# Patient Record
Sex: Male | Born: 1990 | Race: White | Hispanic: No | Marital: Single | Smoking: Current some day smoker
Health system: Southern US, Community
[De-identification: ages and names within clinical notes are randomized; demographics above are authoritative.]

---

## 2015-03-03 ENCOUNTER — Emergency Department (HOSPITAL_COMMUNITY): Payer: BLUE CROSS/BLUE SHIELD

## 2015-03-03 ENCOUNTER — Emergency Department (HOSPITAL_COMMUNITY)
Admission: EM | Admit: 2015-03-03 | Discharge: 2015-03-04 | Disposition: A | Discharge status: Home / Self Care | Payer: BLUE CROSS/BLUE SHIELD | Attending: Emergency Medicine | Admitting: Emergency Medicine

## 2015-03-03 ENCOUNTER — Encounter (HOSPITAL_COMMUNITY): Payer: Self-pay | Admitting: Emergency Medicine

## 2015-03-03 DIAGNOSIS — R112 Nausea with vomiting, unspecified: Secondary | ICD-10-CM | POA: Diagnosis not present

## 2015-03-03 DIAGNOSIS — R319 Hematuria, unspecified: Secondary | ICD-10-CM | POA: Diagnosis present

## 2015-03-03 DIAGNOSIS — R109 Unspecified abdominal pain: Secondary | ICD-10-CM

## 2015-03-03 DIAGNOSIS — R7989 Other specified abnormal findings of blood chemistry: Secondary | ICD-10-CM

## 2015-03-03 DIAGNOSIS — R3915 Urgency of urination: Secondary | ICD-10-CM | POA: Diagnosis not present

## 2015-03-03 DIAGNOSIS — R1031 Right lower quadrant pain: Secondary | ICD-10-CM | POA: Diagnosis not present

## 2015-03-03 LAB — URINE MICROSCOPIC-ADD ON

## 2015-03-03 LAB — CBC
HCT: 47.4 % (ref 39.0–52.0)
HEMOGLOBIN: 16.1 g/dL (ref 13.0–17.0)
MCH: 29.7 pg (ref 26.0–34.0)
MCHC: 34 g/dL (ref 30.0–36.0)
MCV: 87.3 fL (ref 78.0–100.0)
Platelets: 206 10*3/uL (ref 150–400)
RBC: 5.43 MIL/uL (ref 4.22–5.81)
RDW: 13.1 % (ref 11.5–15.5)
WBC: 13.3 10*3/uL — ABNORMAL HIGH (ref 4.0–10.5)

## 2015-03-03 LAB — COMPREHENSIVE METABOLIC PANEL
ALBUMIN: 4.8 g/dL (ref 3.5–5.0)
ALK PHOS: 92 U/L (ref 38–126)
ALT: 23 U/L (ref 17–63)
AST: 50 U/L — AB (ref 15–41)
Anion gap: 14 (ref 5–15)
BILIRUBIN TOTAL: 0.8 mg/dL (ref 0.3–1.2)
BUN: 8 mg/dL (ref 6–20)
CALCIUM: 10.6 mg/dL — AB (ref 8.9–10.3)
CO2: 20 mmol/L — AB (ref 22–32)
Chloride: 107 mmol/L (ref 101–111)
Creatinine, Ser: 1.27 mg/dL — ABNORMAL HIGH (ref 0.61–1.24)
GFR calc Af Amer: >60 mL/min (ref >60)
GFR calc non Af Amer: >60 mL/min (ref >60)
GLUCOSE: 114 mg/dL — AB (ref 65–99)
Potassium: 4.1 mmol/L (ref 3.5–5.1)
SODIUM: 141 mmol/L (ref 135–145)
TOTAL PROTEIN: 9 g/dL — AB (ref 6.5–8.1)

## 2015-03-03 LAB — URINALYSIS, ROUTINE W REFLEX MICROSCOPIC
GLUCOSE, UA: NEGATIVE mg/dL
Ketones, ur: 15 mg/dL — AB
Nitrite: NEGATIVE
PROTEIN: 100 mg/dL — AB
SPECIFIC GRAVITY, URINE: 1.022 (ref 1.005–1.030)
UROBILINOGEN UA: 0.2 mg/dL (ref 0.0–1.0)
pH: 8.5 — ABNORMAL HIGH (ref 5.0–8.0)

## 2015-03-03 LAB — LIPASE, BLOOD: Lipase: 29 U/L (ref 22–51)

## 2015-03-03 MED ORDER — HYDROMORPHONE HCL 1 MG/ML IJ SOLN
1.0000 mg | Freq: Once | INTRAMUSCULAR | Status: AC
  Administered 2015-03-03: 1 mg via INTRAVENOUS
  Filled 2015-03-03: qty 1

## 2015-03-03 MED ORDER — ONDANSETRON 4 MG PO TBDP
4.0000 mg | ORAL_TABLET | Freq: Once | ORAL | Status: DC | PRN
Start: 2015-03-03 — End: 2015-03-04

## 2015-03-03 MED ORDER — SODIUM CHLORIDE 0.9 % IV BOLUS (SEPSIS)
1000.0000 mL | Freq: Once | INTRAVENOUS | Status: AC
  Administered 2015-03-03: 1000 mL via INTRAVENOUS

## 2015-03-03 MED ORDER — OXYCODONE-ACETAMINOPHEN 5-325 MG PO TABS
1.0000 | ORAL_TABLET | Freq: Once | ORAL | Status: AC
  Administered 2015-03-03: 1 via ORAL

## 2015-03-03 MED ORDER — KETOROLAC TROMETHAMINE 30 MG/ML IJ SOLN
30.0000 mg | Freq: Once | INTRAMUSCULAR | Status: AC
  Administered 2015-03-03: 30 mg via INTRAVENOUS
  Filled 2015-03-03: qty 1

## 2015-03-03 MED ORDER — OXYCODONE-ACETAMINOPHEN 5-325 MG PO TABS
ORAL_TABLET | ORAL | Status: AC
  Filled 2015-03-03: qty 1

## 2015-03-03 MED ORDER — ONDANSETRON 4 MG PO TBDP
ORAL_TABLET | ORAL | Status: AC
  Filled 2015-03-03: qty 1

## 2015-03-03 MED ORDER — FENTANYL CITRATE (PF) 100 MCG/2ML IJ SOLN
50.0000 ug | Freq: Once | INTRAMUSCULAR | Status: DC
  Filled 2015-03-03: qty 2

## 2015-03-03 NOTE — ED Provider Notes (Signed)
CSN: [id number]     Arrival date & time 03/03/15  1607 History   First MD Initiated Contact with Patient 03/03/15 2016     Chief Complaint  Patient presents with  . Abdominal Pain  . Hematuria  . Emesis     (Consider location/radiation/quality/duration/timing/severity/associated sxs/prior Treatment) HPI Comments: 24 year old male with no significant past medical history presents to the emergency department for further evaluation of right-sided abdominal pain. Patient states the pain was sudden in onset at 10 AM today. He describes the pain as a pressure-like, sharp sensation which has been constant. At onset of symptoms, patient experienced 2 episodes of emesis. He has had 5-6 subsequent episodes of emesis since this time. Pain was temporarily relieved with Percocet given in triage. Patient has also tried drinking water for symptoms, but this has provided no relief. Patient noted some hematuria this afternoon. He denies a personal or family history of kidney stones. Patient's last bowel movement was 30 minutes prior to ED arrival. Patient denies a history of abdominal surgeries.  Patient is a 24 y.o. male presenting with abdominal pain, hematuria, and vomiting. The history is provided by the patient. No language interpreter was used.  Abdominal Pain Associated symptoms: hematuria, nausea and vomiting   Associated symptoms: no chest pain, no diarrhea, no dysuria, no fever and no shortness of breath   Hematuria Associated symptoms include abdominal pain, nausea and vomiting. Pertinent negatives include no chest pain or fever.  Emesis Associated symptoms: abdominal pain   Associated symptoms: no diarrhea     History reviewed. No pertinent past medical history. History reviewed. No pertinent past surgical history. No family history on file. Social History  Substance Use Topics  . Smoking status: Never Smoker   . Smokeless tobacco: None  . Alcohol Use: No    Review of Systems   Constitutional: Negative for fever.  Respiratory: Negative for shortness of breath.   Cardiovascular: Negative for chest pain.  Gastrointestinal: Positive for nausea, vomiting and abdominal pain. Negative for diarrhea and blood in stool.  Genitourinary: Positive for urgency and hematuria. Negative for dysuria.  All other systems reviewed and are negative.   Allergies  Review of patient's allergies indicates no known allergies.  Home Medications   Prior to Admission medications   Medication Sig Start Date End Date Taking? Authorizing Provider  ondansetron (ZOFRAN) 4 MG tablet Take 1 tablet (4 mg total) by mouth every 6 (six) hours. Take as needed for nausea/vomiting 03/04/15   Antony Madura, PA-C  oxyCODONE-acetaminophen (PERCOCET/ROXICET) 5-325 MG per tablet Take 1-2 tablets by mouth every 6 (six) hours as needed for severe pain. 03/04/15   Antony Madura, PA-C  tamsulosin (FLOMAX) 0.4 MG CAPS capsule Take 1 capsule (0.4 mg total) by mouth daily. 03/04/15   Antony Madura, PA-C   BP 107/62 mmHg  Pulse 54  Temp(Src) 97.4 F (36.3 C) (Oral)  Resp 20  Ht 6' (1.829 m)  Wt 150 lb (68.04 kg)  BMI 20.34 kg/m2  SpO2 99%   Physical Exam  Constitutional: He is oriented to person, place, and time. He appears well-developed and well-nourished. No distress.  Nontoxic/nonseptic appearing  HENT:  Head: Normocephalic and atraumatic.  Eyes: Conjunctivae and EOM are normal. No scleral icterus.  Neck: Normal range of motion.  Cardiovascular: Normal rate, regular rhythm and intact distal pulses.   Pulmonary/Chest: Effort normal and breath sounds normal. No respiratory distress. He has no wheezes. He has no rales.  Respirations even and unlabored. Lungs clear to auscultation bilaterally  Abdominal: Soft. He exhibits no distension. There is tenderness. There is no rebound and no guarding.  TTP in the R mid abdomen and RLQ. Abdomen soft, without masses or rigidity. No peritoneal signs.  Musculoskeletal:  Normal range of motion.  Neurological: He is alert and oriented to person, place, and time. He exhibits normal muscle tone. Coordination normal.  GCS 15. Speech is goal oriented. Patient moves extremities without ataxia.  Skin: Skin is warm and dry. No rash noted. He is not diaphoretic. No erythema. No pallor.  Psychiatric: He has a normal mood and affect. His behavior is normal.  Nursing note and vitals reviewed.   ED Course  Procedures (including critical care time) Labs Review Labs Reviewed  COMPREHENSIVE METABOLIC PANEL - Abnormal; Notable for the following:    CO2 20 (*)    Glucose, Bld 114 (*)    Creatinine, Ser 1.27 (*)    Calcium 10.6 (*)    Total Protein 9.0 (*)    AST 50 (*)    All other components within normal limits  CBC - Abnormal; Notable for the following:    WBC 13.3 (*)    All other components within normal limits  URINALYSIS, ROUTINE W REFLEX MICROSCOPIC (NOT AT [facility]) - Abnormal; Notable for the following:    Color, Urine RED (*)    APPearance TURBID (*)    pH 8.5 (*)    Hgb urine dipstick LARGE (*)    Bilirubin Urine SMALL (*)    Ketones, ur 15 (*)    Protein, ur 100 (*)    Leukocytes, UA SMALL (*)    All other components within normal limits  LIPASE, BLOOD  URINE MICROSCOPIC-ADD ON    Imaging Review Ct Renal Stone Study  03/03/2015   CLINICAL DATA:  Right flank pain, hematuria x1 day  EXAM: CT ABDOMEN AND PELVIS WITHOUT CONTRAST  TECHNIQUE: Multidetector CT imaging of the abdomen and pelvis was performed following the standard protocol without IV contrast.  COMPARISON:  None.  FINDINGS: Lower chest:  Lung bases are clear.  Hepatobiliary: Liver is within normal limits.  Gallbladder is unremarkable. No intrahepatic or extrahepatic ductal dilatation.  Pancreas: Within normal limits.  Spleen: Within normal limits.  Adrenals/Urinary Tract: Adrenal glands are within normal limits.  2 mm nonobstructing right lower pole renal calculus (series 2/ image 44).  Left  kidney is notable for hyperdense medullary pyramids.  No definite ureteral or bladder calculi. Additional 3 mm calcification in the right pelvis (series 2/image 33) is favored to be just anterior to the distal right ureter.  No hydronephrosis.  Bladder is within normal limits.  Stomach/Bowel: Stomach is within normal limits.  No evidence of bowel obstruction.  Normal appendix.  Vascular/Lymphatic: No evidence of abdominal aortic aneurysm.  No suspicious abdominopelvic lymphadenopathy.  Reproductive: Prostate is unremarkable.  Other: Small volume pelvic ascites (series 2/image 77).  Fluid in the left inguinal canal.  Musculoskeletal: Visualized osseous structures are within normal limits.  IMPRESSION: 2 mm nonobstructing right lower pole renal calculus.  No definite ureteral calculi. 3 mm right pelvic calcification is favored to be just anterior to the right ureter. No hydronephrosis.  Hyperdense left medullary pyramids, suggesting medullary nephrocalcinosis.   Electronically Signed   By: Charline Bills M.D.   On: 03/03/2015 23:54     I have personally reviewed and evaluated these images and lab results as part of my medical decision-making.   EKG Interpretation None      MDM   Final diagnoses:  Right flank pain  Hematuria  Elevated serum creatinine    24 year old male presents to the emergency department for sudden onset of right sided abdominal pain with associated vomiting and gross hematuria. Patient is afebrile. Abdomen is soft without guarding or rigidity. Clinical picture is consistent with kidney stones. Patient with a slightly elevated creatinine today. Mild leukocytosis likely secondary to stress from prolonged periods of pain and vomiting. Today to count red blood cells seen on urinalysis. No evidence of UTI.  CT today shows a 2 mm nonobstructing renal calculus in the right lower pole of the kidney. There is also a 3 mm calcification which may represent a kidney stone, though unable  to confirm placement as this appears to be just slightly anterior to the right ureter on CT scan. History of symptoms and laboratory workup, however, make ureterolithiasis more likely. Will treat supportively with Flomax, Percocet, and Zofran when necessary. Patient referred to urology. Return precautions discussed and provided. Patient agreeable to plan with no unaddressed concerns. Patient discharged in good condition; pain absent since tx with Toradol and Dilaudid.   Filed Vitals:   03/03/15 2200 03/03/15 2258 03/03/15 2259 03/03/15 2300  BP: 103/53 110/71  107/62  Pulse: 49  59 54  Temp:      TempSrc:      Resp: 20     Height:      Weight:      SpO2: 99% 100%  99%     Antony Madura, PA-C 03/04/15 0016  Arby Barrette, MD 03/04/15 [phone]

## 2015-03-03 NOTE — ED Notes (Addendum)
Pt from home for eval of RLQ abd pain that started today, pt states 5 episodes of emesis and hematuria. Pt denies any other penile discharge at this time. Pt states he may have food poisoning from [employer]. Pt uncomfortable in triage. Pt alert and oriented. Pt denies any hx of kidney stones.

## 2015-03-04 MED ORDER — OXYCODONE-ACETAMINOPHEN 5-325 MG PO TABS: 1.0000 | ORAL_TABLET | Freq: Four times a day (QID) | ORAL | Status: DC | PRN

## 2015-03-04 MED ORDER — OXYCODONE-ACETAMINOPHEN 5-325 MG PO TABS
1.0000 | ORAL_TABLET | Freq: Once | ORAL | Status: AC
  Administered 2015-03-04: 1 via ORAL
  Filled 2015-03-04: qty 1

## 2015-03-04 MED ORDER — ONDANSETRON HCL 4 MG PO TABS: 4.0000 mg | ORAL_TABLET | Freq: Four times a day (QID) | ORAL | Status: DC

## 2015-03-04 MED ORDER — TAMSULOSIN HCL 0.4 MG PO CAPS: 0.4000 mg | ORAL_CAPSULE | Freq: Every day | ORAL | Status: DC

## 2015-03-04 NOTE — ED Notes (Signed)
Discharge instructions/prescriptions reviewed with patient. Understanding verbalized. Patient declined wheelchair at time of discharge. No acute distress noted. 

## 2015-03-04 NOTE — Discharge Instructions (Signed)

## 2015-03-12 ENCOUNTER — Encounter (HOSPITAL_COMMUNITY): Payer: Self-pay

## 2015-03-12 ENCOUNTER — Emergency Department (HOSPITAL_COMMUNITY)
Admission: EM | Admit: 2015-03-12 | Discharge: 2015-03-13 | Disposition: A | Discharge status: Home / Self Care | Payer: BLUE CROSS/BLUE SHIELD | Attending: Emergency Medicine | Admitting: Emergency Medicine

## 2015-03-12 DIAGNOSIS — N23 Unspecified renal colic: Secondary | ICD-10-CM | POA: Diagnosis not present

## 2015-03-12 DIAGNOSIS — R112 Nausea with vomiting, unspecified: Secondary | ICD-10-CM | POA: Diagnosis not present

## 2015-03-12 DIAGNOSIS — E860 Dehydration: Secondary | ICD-10-CM | POA: Diagnosis not present

## 2015-03-12 DIAGNOSIS — Z79899 Other long term (current) drug therapy: Secondary | ICD-10-CM | POA: Diagnosis not present

## 2015-03-12 DIAGNOSIS — R109 Unspecified abdominal pain: Secondary | ICD-10-CM | POA: Diagnosis present

## 2015-03-12 MED ORDER — KETOROLAC TROMETHAMINE 30 MG/ML IJ SOLN
30.0000 mg | Freq: Once | INTRAMUSCULAR | Status: AC
  Administered 2015-03-13: 30 mg via INTRAVENOUS
  Filled 2015-03-12: qty 1

## 2015-03-12 MED ORDER — ONDANSETRON HCL 4 MG/2ML IJ SOLN
4.0000 mg | Freq: Once | INTRAMUSCULAR | Status: AC
  Administered 2015-03-13: 4 mg via INTRAVENOUS
  Filled 2015-03-12: qty 2

## 2015-03-12 MED ORDER — MORPHINE SULFATE (PF) 4 MG/ML IV SOLN
4.0000 mg | Freq: Once | INTRAVENOUS | Status: AC
  Administered 2015-03-13: 4 mg via INTRAVENOUS
  Filled 2015-03-12: qty 1

## 2015-03-12 MED ORDER — SODIUM CHLORIDE 0.9 % IV BOLUS (SEPSIS)
1000.0000 mL | Freq: Once | INTRAVENOUS | Status: AC
  Administered 2015-03-13: 1000 mL via INTRAVENOUS

## 2015-03-12 NOTE — ED Provider Notes (Signed)
CSN: [id number]     Arrival date & time 03/12/15  2209 History  By signing my name below, I, Lyndel Safe, attest that this documentation has been prepared under the direction and in the presence of Loren Racer, MD. Electronically Signed: Lyndel Safe, ED Scribe. 03/12/2015. 12:36 AM.    Chief Complaint  Patient presents with  . Flank Pain   The history is provided by the patient. No language interpreter was used.   HPI Comments: Louis Hawkins is a 24 y.o. male who presents to the Emergency Department complaining of sudden onset right-sided abdominal pain radiating to groin upon with waking up this morning s/p kidney stone diagnosis 8 days ago. He notes associated RLQ abdominal pain, decreased urination, nausea and 5 episodes of vomiting PTA. The pt was evaluated in the ED 8 days ago when a CT renal study was obtained and he was diagnosed with a 2mm renal calculi in the right lower pole of kidney. He states his pain associated with renal calculi was gradually improving but returned this morning. He has been taking percocet and zofran with mild to no relief. Last bowel movement was 1 day ago. He additionally reports a decreased appetite and states he has been unable to keep PO fluids down. NKDA.   History reviewed. No pertinent past medical history. History reviewed. No pertinent past surgical history. History reviewed. No pertinent family history. Social History  Substance Use Topics  . Smoking status: Never Smoker   . Smokeless tobacco: None  . Alcohol Use: No    Review of Systems  Constitutional: Positive for appetite change. Negative for fever and chills.  Respiratory: Negative for shortness of breath.   Cardiovascular: Negative for chest pain.  Gastrointestinal: Positive for nausea, vomiting and abdominal pain. Negative for diarrhea.  Genitourinary: Positive for flank pain, decreased urine volume and difficulty urinating. Negative for frequency, hematuria, scrotal swelling,  penile pain and testicular pain.  Musculoskeletal: Positive for back pain. Negative for neck pain and neck stiffness.  Skin: Negative for rash and wound.  Neurological: Negative for dizziness, weakness, light-headedness, numbness and headaches.  All other systems reviewed and are negative.  Allergies  Review of patient's allergies indicates no known allergies.  Home Medications   Prior to Admission medications   Medication Sig Start Date End Date Taking? Authorizing Deicy Rusk  ondansetron (ZOFRAN) 4 MG tablet Take 1 tablet (4 mg total) by mouth every 6 (six) hours. Take as needed for nausea/vomiting 03/04/15  Yes Antony Madura, PA-C  tamsulosin (FLOMAX) 0.4 MG CAPS capsule Take 1 capsule (0.4 mg total) by mouth daily. 03/04/15  Yes Antony Madura, PA-C  oxyCODONE-acetaminophen (PERCOCET/ROXICET) 5-325 MG tablet Take 1-2 tablets by mouth every 6 (six) hours as needed for severe pain. 03/13/15   Loren Racer, MD   BP 122/79 mmHg  Pulse 60  Temp(Src) 98.4 F (36.9 C) (Oral)  Resp 20  SpO2 100% Physical Exam  Constitutional: He is oriented to person, place, and time. He appears well-developed and well-nourished. No distress.  Patient is in no apparent distress. Resting comfortably in bed.  HENT:  Head: Normocephalic and atraumatic.  Mouth/Throat: Oropharynx is clear and moist.  Eyes: EOM are normal. Pupils are equal, round, and reactive to light.  Neck: Normal range of motion. Neck supple.  Cardiovascular: Normal rate and regular rhythm.   Pulmonary/Chest: Effort normal and breath sounds normal. No respiratory distress. He has no wheezes. He has no rales.  Abdominal: Soft. Bowel sounds are normal. He exhibits distension (diffuse abdominal  distention). He exhibits no mass. There is tenderness (right lower quadrant tenderness to palpation. There is no rebound or guarding.). There is no rebound and no guarding.  Musculoskeletal: Normal range of motion. He exhibits tenderness. He exhibits no  edema.  Mild right flank tenderness to percussion.  Neurological: He is alert and oriented to person, place, and time.  Skin: Skin is warm and dry. No rash noted. No erythema.  Psychiatric: He has a normal mood and affect. His behavior is normal.  Nursing note and vitals reviewed.   ED Course  Procedures  DIAGNOSTIC STUDIES: Oxygen Saturation is 100% on RA, normal by my interpretation.    COORDINATION OF CARE: 11:45 PM Discussed treatment plan with pt at bedside and pt agreed to plan.   Labs Review Labs Reviewed  CBC WITH DIFFERENTIAL/PLATELET - Abnormal; Notable for the following:    WBC 15.4 (*)    Neutro Abs 12.8 (*)    Monocytes Absolute 1.2 (*)    All other components within normal limits  COMPREHENSIVE METABOLIC PANEL - Abnormal; Notable for the following:    Creatinine, Ser 1.58 (*)    GFR calc non Af Amer 59 (*)    All other components within normal limits  URINALYSIS, ROUTINE W REFLEX MICROSCOPIC (NOT AT [facility]) - Abnormal; Notable for the following:    APPearance CLOUDY (*)    Hgb urine dipstick LARGE (*)    Bilirubin Urine SMALL (*)    Ketones, ur >80 (*)    Protein, ur 100 (*)    Leukocytes, UA SMALL (*)    All other components within normal limits  LIPASE, BLOOD  URINE MICROSCOPIC-ADD ON    Imaging Review US Renal  03/13/2015   CLINICAL DATA:  Right flank pain.  EXAM: RENAL / URINARY TRACT ULTRASOUND COMPLETE  COMPARISON:  Noncontrast CT 03/03/2015  FINDINGS: Right Kidney:  Length: 11.1 cm. There is mild right hydronephrosis. There is right hydroureter measuring 7 mm proximally, 6 mm mid, and 6.5 mm distally. An obstructing stone in the ureter is not confidently identified, however there is questionable intraluminal debris in the distal ureter. Renal echogenicity within normal limits. The small stone on prior CT is not seen.  Left Kidney:  Length: 9.8 cm. Echogenicity within normal limits. No mass or hydronephrosis visualized.  Bladder:  Completely decompressed  and not evaluated. Could not assessed for ureteral jets.  IMPRESSION: 1. Mild right hydronephrosis and hydroureter. An ostructing stone is not seen sonographically, there is questionable distal ureteric debris which may be secondary to a distal obstructing stone that is not seen sonographically. 2. No left hydronephrosis.   Electronically Signed   By: Rubye Oaks M.D.   On: 03/13/2015 01:05   I have personally reviewed and evaluated these images and lab results as part of my medical decision-making.   MDM   Final diagnoses:  Renal colic on right side  Dehydration    I personally performed the services described in this documentation, which was scribed in my presence. The recorded information has been reviewed and is accurate.   Patient states his pain is completely resolved. Ultrasound without evidence of right sided hydronephrosis. Patient given 2 L IV fluids in the emergency department. No further vomiting. Has point the follow-up with urology. Return precautions given.  Loren Racer, MD 03/13/15 [phone]

## 2015-03-12 NOTE — ED Notes (Signed)
Pt dx with 2 and 3mm kidney stones last week, he started hurting again this am and he's unable to urinate, pt is tearful is triage

## 2015-03-13 ENCOUNTER — Emergency Department (HOSPITAL_COMMUNITY): Payer: BLUE CROSS/BLUE SHIELD

## 2015-03-13 LAB — CBC WITH DIFFERENTIAL/PLATELET
BASOS ABS: 0 10*3/uL (ref 0.0–0.1)
Basophils Relative: 0 %
Eosinophils Absolute: 0 10*3/uL (ref 0.0–0.7)
Eosinophils Relative: 0 %
HCT: 41.8 % (ref 39.0–52.0)
HEMOGLOBIN: 14.4 g/dL (ref 13.0–17.0)
LYMPHS ABS: 1.4 10*3/uL (ref 0.7–4.0)
LYMPHS PCT: 9 %
MCH: 29.7 pg (ref 26.0–34.0)
MCHC: 34.4 g/dL (ref 30.0–36.0)
MCV: 86.2 fL (ref 78.0–100.0)
Monocytes Absolute: 1.2 10*3/uL — ABNORMAL HIGH (ref 0.1–1.0)
Monocytes Relative: 8 %
NEUTROS ABS: 12.8 10*3/uL — AB (ref 1.7–7.7)
NEUTROS PCT: 83 %
Platelets: 180 10*3/uL (ref 150–400)
RBC: 4.85 MIL/uL (ref 4.22–5.81)
RDW: 13.2 % (ref 11.5–15.5)
WBC: 15.4 10*3/uL — AB (ref 4.0–10.5)

## 2015-03-13 LAB — URINE MICROSCOPIC-ADD ON

## 2015-03-13 LAB — COMPREHENSIVE METABOLIC PANEL
ALK PHOS: 76 U/L (ref 38–126)
ALT: 25 U/L (ref 17–63)
AST: 35 U/L (ref 15–41)
Albumin: 5 g/dL (ref 3.5–5.0)
Anion gap: 10 (ref 5–15)
BUN: 13 mg/dL (ref 6–20)
CALCIUM: 9.8 mg/dL (ref 8.9–10.3)
CO2: 26 mmol/L (ref 22–32)
CREATININE: 1.58 mg/dL — AB (ref 0.61–1.24)
Chloride: 101 mmol/L (ref 101–111)
GFR, EST NON AFRICAN AMERICAN: 59 mL/min — AB (ref >60)
Glucose, Bld: 97 mg/dL (ref 65–99)
Potassium: 3.8 mmol/L (ref 3.5–5.1)
Sodium: 137 mmol/L (ref 135–145)
Total Bilirubin: 0.9 mg/dL (ref 0.3–1.2)
Total Protein: 7.9 g/dL (ref 6.5–8.1)

## 2015-03-13 LAB — URINALYSIS, ROUTINE W REFLEX MICROSCOPIC
GLUCOSE, UA: NEGATIVE mg/dL
Ketones, ur: >80 mg/dL — AB
Nitrite: NEGATIVE
PH: 6 (ref 5.0–8.0)
PROTEIN: 100 mg/dL — AB
Specific Gravity, Urine: 1.025 (ref 1.005–1.030)
Urobilinogen, UA: 1 mg/dL (ref 0.0–1.0)

## 2015-03-13 LAB — LIPASE, BLOOD: LIPASE: 24 U/L (ref 22–51)

## 2015-03-13 MED ORDER — OXYCODONE-ACETAMINOPHEN 5-325 MG PO TABS: 1.0000 | ORAL_TABLET | Freq: Four times a day (QID) | ORAL | Status: DC | PRN

## 2015-03-13 MED ORDER — SODIUM CHLORIDE 0.9 % IV BOLUS (SEPSIS)
1000.0000 mL | Freq: Once | INTRAVENOUS | Status: AC
  Administered 2015-03-13: 1000 mL via INTRAVENOUS

## 2015-03-13 NOTE — Discharge Instructions (Signed)
Dehydration, Adult Dehydration means your body does not have as much fluid as it needs. Your kidneys, brain, and heart will not work properly without the right amount of fluids and salt.  HOME CARE  Ask your doctor how to replace body fluid losses (rehydrate).  Drink enough fluids to keep your pee (urine) clear or pale yellow.  Drink small amounts of fluids often if you feel sick to your stomach (nauseous) or throw up (vomit).  Eat like you normally do.  Avoid:  Foods or drinks high in sugar.  Bubbly (carbonated) drinks.  Juice.  Very hot or cold fluids.  Drinks with caffeine.  Fatty, greasy foods.  Alcohol.  Tobacco.  Eating too much.  Gelatin desserts.  Wash your hands to avoid spreading germs (bacteria, viruses).  Only take medicine as told by your doctor.  Keep all doctor visits as told. GET HELP RIGHT AWAY IF:   You cannot drink something without throwing up.  You get worse even with treatment.  Your vomit has blood in it or looks greenish.  Your poop (stool) has blood in it or looks black and tarry.  You have not peed in 6 to 8 hours.  You pee a small amount of very dark pee.  You have a fever.  You pass out (faint).  You have belly (abdominal) pain that gets worse or stays in one spot (localizes).  You have a rash, stiff neck, or bad headache.  You get easily annoyed, sleepy, or are hard to wake up.  You feel weak, dizzy, or very thirsty. MAKE SURE YOU:   Understand these instructions.  Will watch your condition.  Will get help right away if you are not doing well or get worse. Document Released: 03/27/2009 Document Revised: 08/23/2011 Document Reviewed: 01/18/2011 [facility] Patient Information 2015 [place], [place]. This information is not intended to replace advice given to you by your health care provider. Make sure you discuss any questions you have with your health care provider.  Kidney Stones Kidney stones (urolithiasis) are solid  masses that form inside your kidneys. The intense pain is caused by the stone moving through the kidney, ureter, bladder, and urethra (urinary tract). When the stone moves, the ureter starts to spasm around the stone. The stone is usually passed in your pee (urine).  HOME CARE  Drink enough fluids to keep your pee clear or pale yellow. This helps to get the stone out.  Strain all pee through the provided strainer. Do not pee without peeing through the strainer, not even once. If you pee the stone out, catch it in the strainer. The stone may be as small as a grain of salt. Take this to your doctor. This will help your doctor figure out what you can do to try to prevent more kidney stones.  Only take medicine as told by your doctor.  Follow up with your doctor as told.  Get follow-up X-rays as told by your doctor. GET HELP IF: You have pain that gets worse even if you have been taking pain medicine. GET HELP RIGHT AWAY IF:   Your pain does not get better with medicine.  You have a fever or shaking chills.  Your pain increases and gets worse over 18 hours.  You have new belly (abdominal) pain.  You feel faint or pass out.  You are unable to pee. MAKE SURE YOU:   Understand these instructions.  Will watch your condition.  Will get help right away if you are not  doing well or get worse. Document Released: 11/17/2007 Document Revised: 01/31/2013 Document Reviewed: 11/01/2012 [facility] Patient Information 2015 [place], [place]. This information is not intended to replace advice given to you by your health care provider. Make sure you discuss any questions you have with your health care provider.

## 2016-08-03 IMAGING — CT CT RENAL STONE PROTOCOL
2 of 4 series · 15 of 46 positions shown, 17 images · non-contrast
Comparison: None.

CLINICAL DATA: Right flank pain, hematuria x1 day

EXAM:
CT ABDOMEN AND PELVIS WITHOUT CONTRAST
TECHNIQUE: Multidetector CT imaging of the abdomen and pelvis was performed
following the standard protocol without IV contrast.

[Series 2: stone study 5.0 i30f 1 · axial · 0.72mm/px · z∈[-524,-79]mm · 12 of 99 slices shown, 14 images]
[im 5/99  soft-tissue]
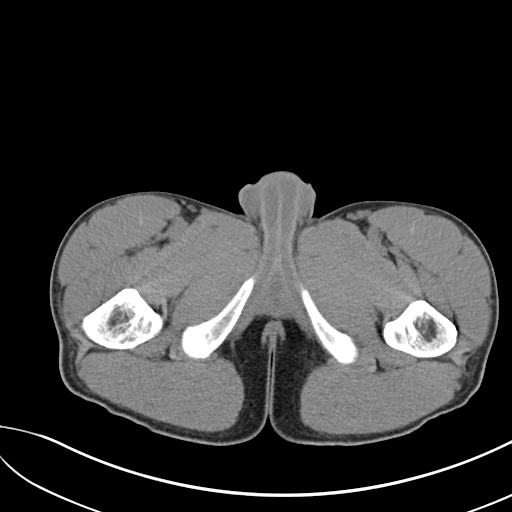
[im 5/99  bone]
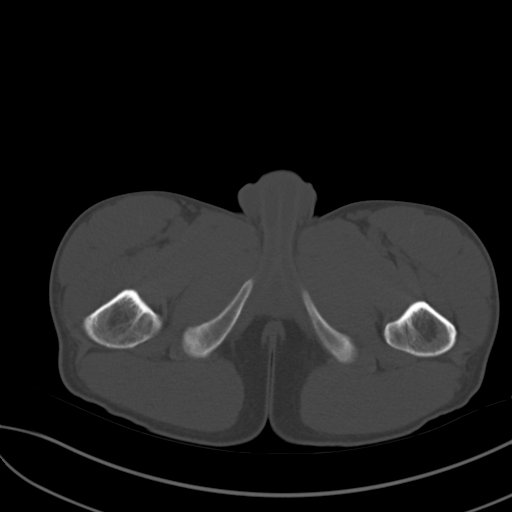
[im 13/99  soft-tissue]
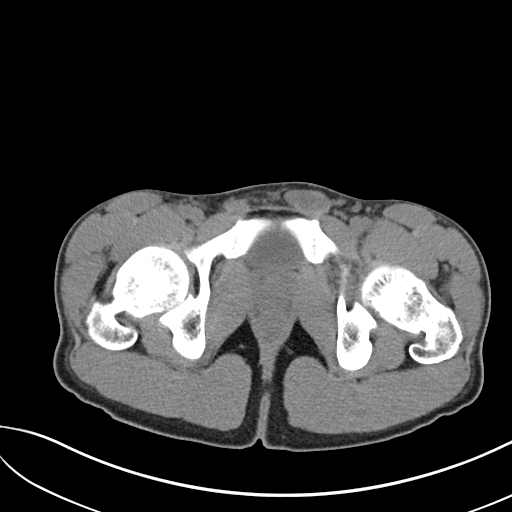
[im 22/99  soft-tissue]
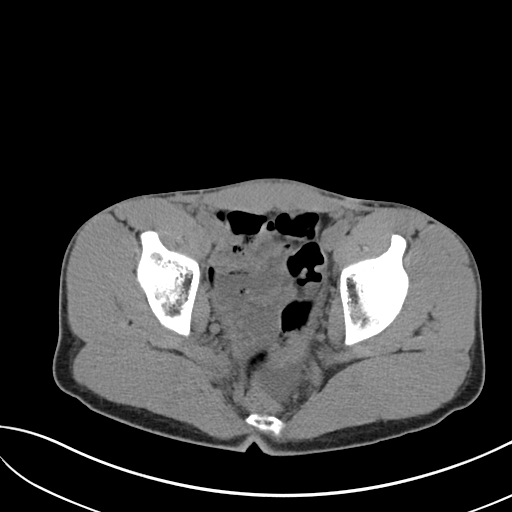
[im 30/99  soft-tissue]
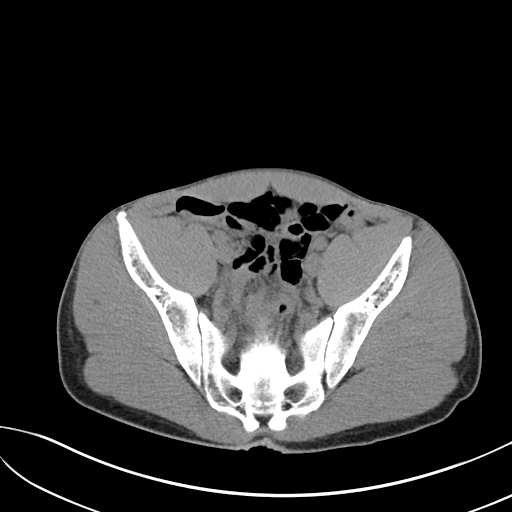
[im 39/99  soft-tissue]
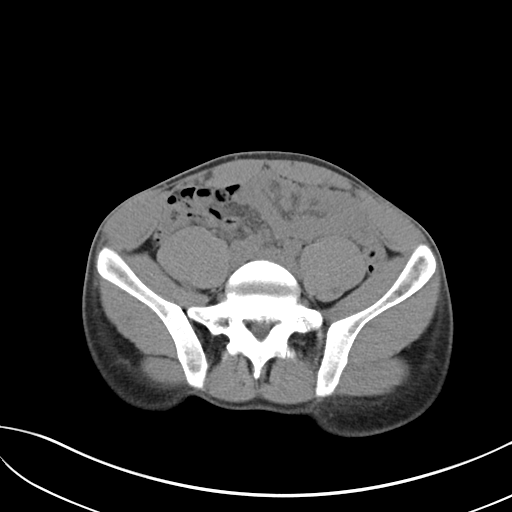
[im 47/99  soft-tissue]
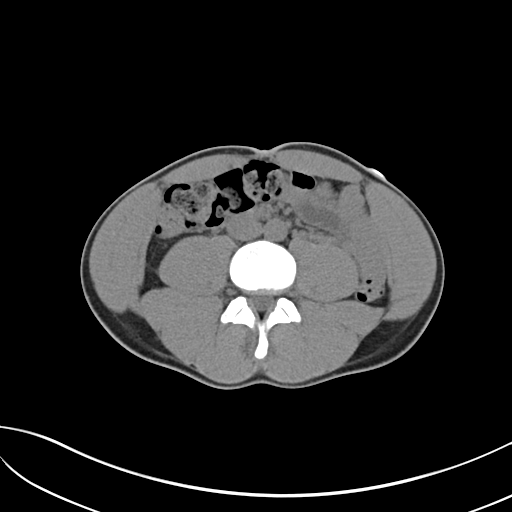
[im 52/99  soft-tissue]
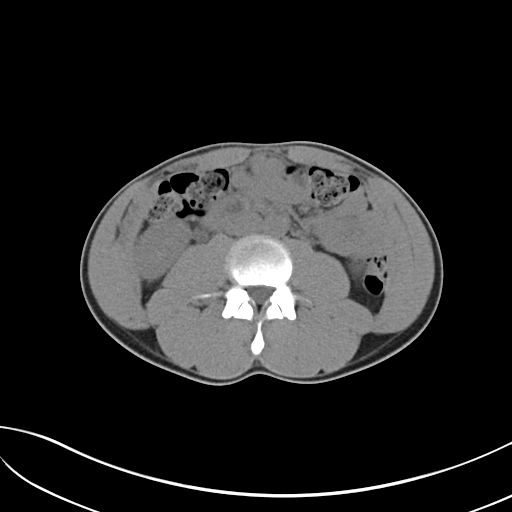
[im 60/99  soft-tissue]
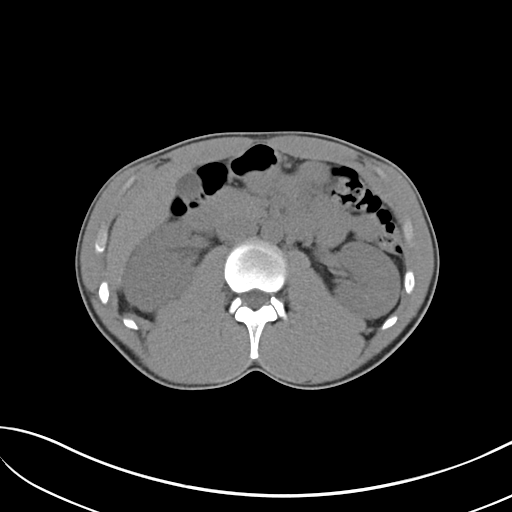
[im 69/99  soft-tissue]
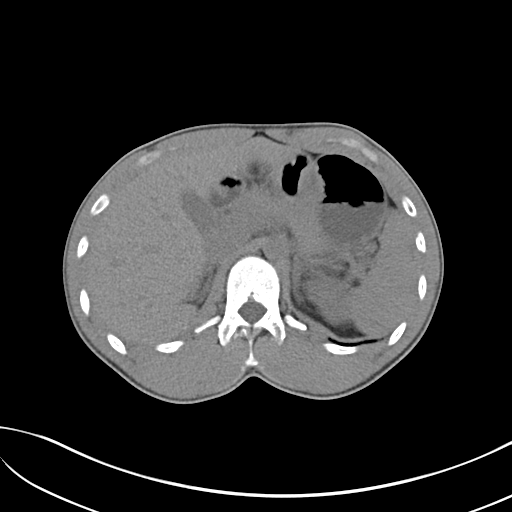
[im 69/99  bone]
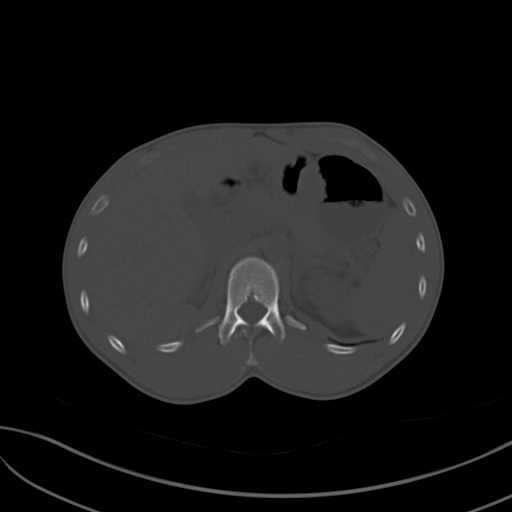
[im 77/99  soft-tissue]
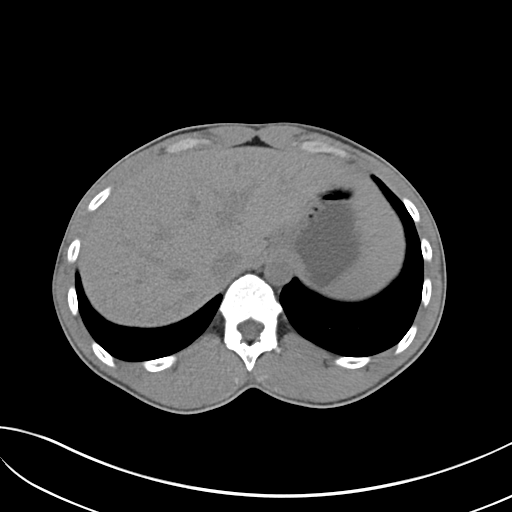
[im 86/99  soft-tissue]
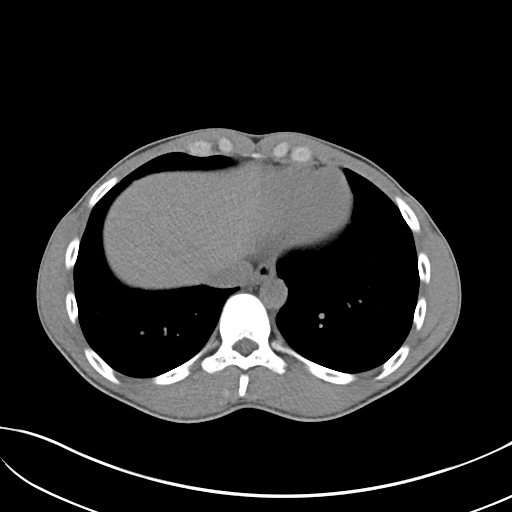
[im 94/99  soft-tissue]
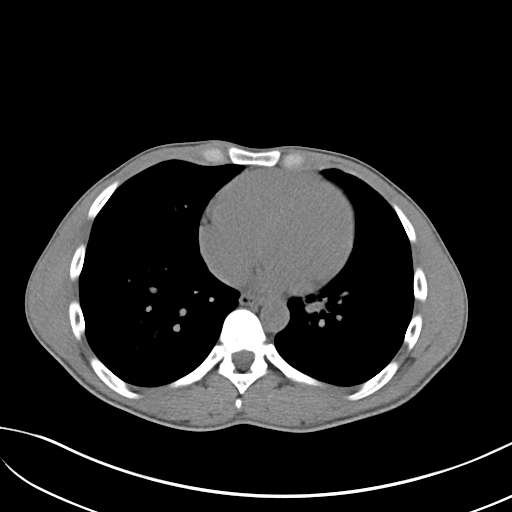

[Series 5: coronal soft tissue · coronal · 0.56mm/px · 3 of 62 slices shown]
[im 21/62  soft-tissue]
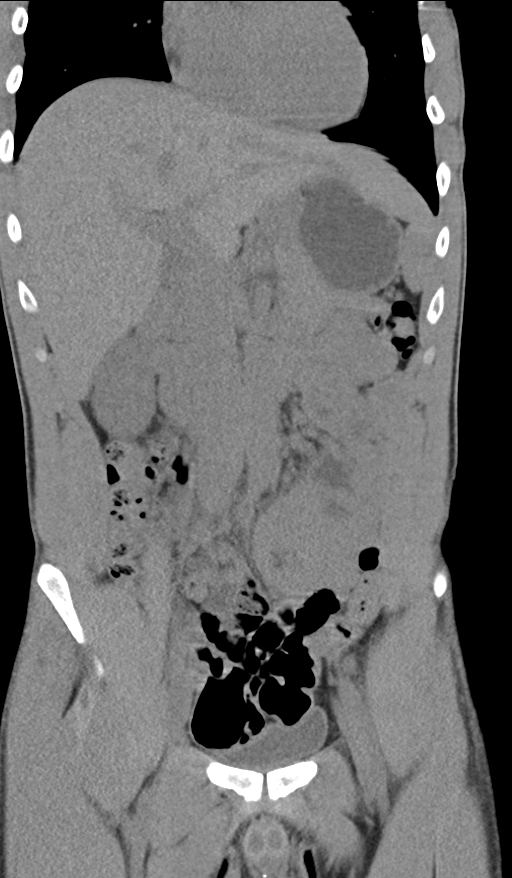
[im 28/62  soft-tissue]
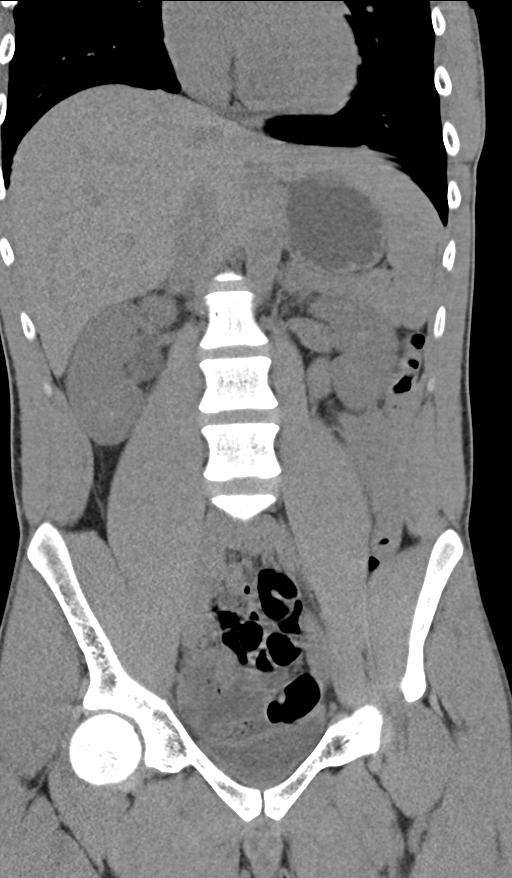
[im 34/62  soft-tissue]
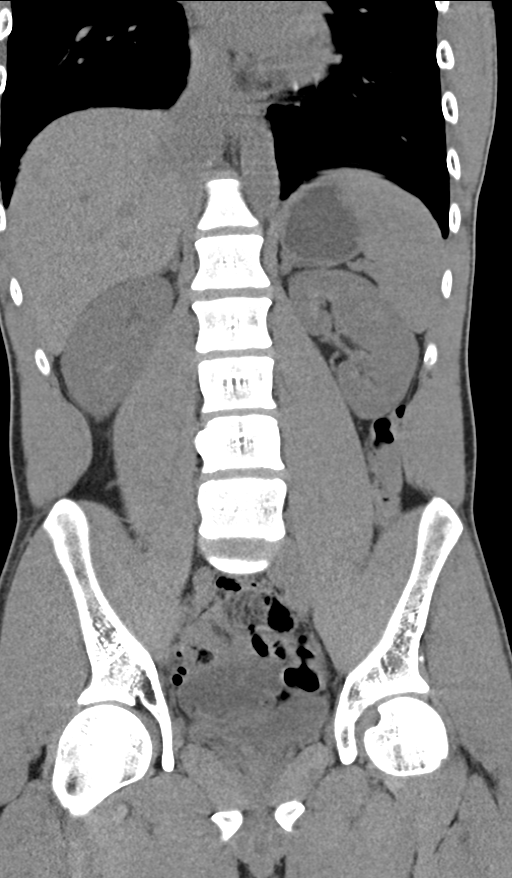

[15 of 46 positions shown; findings below may reference images not displayed]

FINDINGS: Lower chest:  Lung bases are clear.

Hepatobiliary: Liver is within normal limits.

Gallbladder is unremarkable. No intrahepatic or extrahepatic ductal
dilatation.

Pancreas: Within normal limits.

Spleen: Within normal limits.

Adrenals/Urinary Tract: Adrenal glands are within normal limits.

2 mm nonobstructing right lower pole renal calculus (series 2/ image
44).

Left kidney is notable for hyperdense medullary pyramids.

No definite ureteral or bladder calculi. Additional 3 mm
calcification in the right pelvis (series 2/image 33) is favored to
be just anterior to the distal right ureter.

No hydronephrosis.

Bladder is within normal limits.

Stomach/Bowel: Stomach is within normal limits.

No evidence of bowel obstruction.

Normal appendix.

Vascular/Lymphatic: No evidence of abdominal aortic aneurysm.

No suspicious abdominopelvic lymphadenopathy.

Reproductive: Prostate is unremarkable.

Other: Small volume pelvic ascites (series 2/image 77).

Fluid in the left inguinal canal.

Musculoskeletal: Visualized osseous structures are within normal
limits.
IMPRESSION: 2 mm nonobstructing right lower pole renal calculus.

No definite ureteral calculi. 3 mm right pelvic calcification is
favored to be just anterior to the right ureter. No hydronephrosis.

Hyperdense left medullary pyramids, suggesting medullary
nephrocalcinosis.

## 2017-09-17 ENCOUNTER — Encounter (HOSPITAL_COMMUNITY): Payer: Self-pay | Admitting: *Deleted

## 2017-09-17 ENCOUNTER — Other Ambulatory Visit: Payer: Self-pay

## 2017-09-17 ENCOUNTER — Ambulatory Visit (HOSPITAL_COMMUNITY)
Admission: EM | Admit: 2017-09-17 | Discharge: 2017-09-17 | Disposition: A | Discharge status: Home / Self Care | Payer: PRIVATE HEALTH INSURANCE | Attending: Internal Medicine | Admitting: Internal Medicine

## 2017-09-17 DIAGNOSIS — R42 Dizziness and giddiness: Secondary | ICD-10-CM

## 2017-09-17 DIAGNOSIS — K529 Noninfective gastroenteritis and colitis, unspecified: Secondary | ICD-10-CM

## 2017-09-17 MED ORDER — ONDANSETRON 4 MG PO TBDP
ORAL_TABLET | ORAL | Status: AC
  Filled 2017-09-17: qty 1

## 2017-09-17 MED ORDER — ONDANSETRON HCL 4 MG PO TABS: 4.0000 mg | ORAL_TABLET | Freq: Three times a day (TID) | ORAL | 0 refills | Status: DC | PRN

## 2017-09-17 MED ORDER — ONDANSETRON 4 MG PO TBDP
4.0000 mg | ORAL_TABLET | Freq: Once | ORAL | Status: AC
  Administered 2017-09-17: 4 mg via ORAL

## 2017-09-17 MED ORDER — SODIUM CHLORIDE 0.9 % IV BOLUS
1000.0000 mL | Freq: Once | INTRAVENOUS | Status: AC
  Administered 2017-09-17: 1000 mL via INTRAVENOUS

## 2017-09-17 NOTE — ED Provider Notes (Signed)
[facility]    CSN: [id number] Arrival date & time: 09/17/17  1233     History   Chief Complaint Chief Complaint  Patient presents with  . Emesis  . Nausea    HPI Louis Hawkins is a 27 y.o. male.   Louis Hawkins presents with complaints of nausea, vomiting, and diarrhea, which started this morning. States he feels intermittent hot/cold dizziness. Has had approximately 8 episodes of vomiting and diarrhea. Vomited here last while waiting. Without blood. Denies abdominal pain, his stomach feels sore but without pain. No known ill contacts. Ate out at a restaurant last night, no others ill. Has been drinking some. Has not eaten today. Urinating. Without contributing medical history, doesn't take any medications regularly.   ROS per HPI.      History reviewed. No pertinent past medical history.  There are no active problems to display for this patient.   History reviewed. No pertinent surgical history.     Home Medications    Prior to Admission medications   Medication Sig Start Date End Date Taking? Authorizing Provider  ondansetron (ZOFRAN) 4 MG tablet Take 1 tablet (4 mg total) by mouth every 8 (eight) hours as needed for nausea or vomiting. 09/17/17   Georgetta Haber, NP    Family History History reviewed. No pertinent family history.  Social History Social History   Tobacco Use  . Smoking status: Never Smoker  . Smokeless tobacco: Never Used  Substance Use Topics  . Alcohol use: No  . Drug use: Not on file     Allergies   Patient has no known allergies.   Review of Systems Review of Systems   Physical Exam Triage Vital Signs ED Triage Vitals  Enc Vitals Group     BP 09/17/17 1347 132/64     Pulse Rate 09/17/17 1347 90     Resp --      Temp 09/17/17 1347 98.3 F (36.8 C)     Temp Source 09/17/17 1347 Oral     SpO2 09/17/17 1347 100 %     Weight --      Height --      Head Circumference --      Peak Flow --      Pain Score 09/17/17  1345 10     Pain Loc --      Pain Edu? --      Excl. in GC? --    No data found.  Updated Vital Signs BP 132/64 (BP Location: Left Arm)   Pulse 90   Temp 98.3 F (36.8 C) (Oral)   SpO2 100%   Visual Acuity Right Eye Distance:   Left Eye Distance:   Bilateral Distance:    Right Eye Near:   Left Eye Near:    Bilateral Near:     Physical Exam  Constitutional: He is oriented to person, place, and time. He appears well-developed and well-nourished.  Cardiovascular: Normal rate and regular rhythm.  Pulmonary/Chest: Effort normal and breath sounds normal.  Abdominal: Soft. Bowel sounds are normal. There is generalized tenderness. There is no rigidity, no rebound, no guarding, no CVA tenderness, no tenderness at McBurney's point and negative Murphy's sign.  Neurological: He is alert and oriented to person, place, and time.  Skin: Skin is warm and dry. There is pallor.     UC Treatments / Results  Labs (all labs ordered are listed, but only abnormal results are displayed) Labs Reviewed - No data to display  EKG None  Radiology No results found.  Procedures Procedures (including critical care time)  Medications Ordered in UC Medications  ondansetron (ZOFRAN-ODT) disintegrating tablet 4 mg (4 mg Oral Given 09/17/17 1402)  sodium chloride 0.9 % bolus 1,000 mL (0 mLs Intravenous Stopped 09/17/17 1539)     Initial Impression / Assessment and Plan / UC Course  I have reviewed the triage vital signs and the nursing notes.  Pertinent labs & imaging results that were available during my care of the patient were reviewed by me and considered in my medical decision making (see chart for details).     Patient requesting ivf due to dizziness sensation s/p vomiting and diarrhea. zofran provided and 1 l of fluids. Remains likely viral in nature. zofran prn. Small frequent fluids. Return precautions provided. Patient verbalized understanding and agreeable to plan.    Final Clinical  Impressions(s) / UC Diagnoses   Final diagnoses:  Gastroenteritis    ED Discharge Orders        Ordered    ondansetron (ZOFRAN) 4 MG tablet  Every 8 hours PRN     09/17/17 1420       Controlled Substance Prescriptions [place] Controlled Substance Registry consulted? Not Applicable   Georgetta HaberBurky, Rino Hosea B, NP 09/17/17 1542

## 2017-09-17 NOTE — ED Notes (Signed)
Patient is yelling for nurse.  Have gone to room.  Patient is lying  flat on his side and saying he is "going to faint and needs medicine now!, needs iv, now!"  Attempted to reassure patient that he is ok.  Patient interjects with every statement by this nurse that he is not ok and needs iv and needs medicine and he is going to faint.  Attempts to reassure quickly become argumentative by this patient.

## 2017-09-17 NOTE — Discharge Instructions (Signed)
Small frequent sips of fluids only today. Tomorrow may advance to bland diet as tolerated. Zofran as needed for nausea.  If symptoms worsen or do not improve in the next week to return to be seen or to follow up with your PCP.

## 2017-09-17 NOTE — ED Triage Notes (Signed)
Dizziness, vomiting, diarrhea, ate [place] food last night and since then he's been feeling really bad, per pt he is nauseous,

## 2017-09-17 NOTE — ED Notes (Signed)
Patient calmer at discharge, patient receptive of instructions

## 2017-09-20 ENCOUNTER — Emergency Department (HOSPITAL_COMMUNITY)
Admission: EM | Admit: 2017-09-20 | Discharge: 2017-09-20 | Disposition: A | Discharge status: Home / Self Care | Payer: PRIVATE HEALTH INSURANCE | Attending: Emergency Medicine | Admitting: Emergency Medicine

## 2017-09-20 ENCOUNTER — Encounter (HOSPITAL_COMMUNITY): Payer: Self-pay | Admitting: Emergency Medicine

## 2017-09-20 DIAGNOSIS — R1084 Generalized abdominal pain: Secondary | ICD-10-CM | POA: Insufficient documentation

## 2017-09-20 MED ORDER — DICYCLOMINE HCL 10 MG PO CAPS
10.0000 mg | ORAL_CAPSULE | Freq: Once | ORAL | Status: AC
  Administered 2017-09-20: 10 mg via ORAL
  Filled 2017-09-20: qty 1

## 2017-09-20 MED ORDER — ALBUTEROL SULFATE (2.5 MG/3ML) 0.083% IN NEBU
5.0000 mg | INHALATION_SOLUTION | Freq: Once | RESPIRATORY_TRACT | Status: AC
  Administered 2017-09-20: 5 mg via RESPIRATORY_TRACT
  Filled 2017-09-20: qty 6

## 2017-09-20 NOTE — ED Provider Notes (Signed)
[facility]   CSN: [id number] Arrival date & time: 09/20/17  0208     History   Chief Complaint Chief Complaint  Patient presents with  . Shortness of Breath  . Abdominal Pain    HPI Louis Hawkins is a 27 y.o. male without significant past medical history, presenting to the ED with complaints of constipation that began yesterday.  Patient states he had a GI virus over the weekend which has resolved.  He states he has been eating fruits since then, however had his first solid meal yesterday which consisted of pizza and hot wings.  He states following that meal he began having stomach upset and cramping.  He reports to the ED requesting "I need a pill to clear the pizza out."  He reports normal bowel movement yesterday prior to pizza meal, however states he has not had a bowel movement since that time.  Denies nausea, vomiting, or other complaints.  Patient did complain of shortness of breath in triage, however states this is resolved and has no further respiratory complaints.  The history is provided by the patient.    History reviewed. No pertinent past medical history.  There are no active problems to display for this patient.   History reviewed. No pertinent surgical history.      Home Medications    Prior to Admission medications   Medication Sig Start Date End Date Taking? Authorizing Provider  ondansetron (ZOFRAN) 4 MG tablet Take 1 tablet (4 mg total) by mouth every 8 (eight) hours as needed for nausea or vomiting. 09/17/17  Yes Georgetta HaberBurky, Natalie B, NP    Family History No family history on file.  Social History Social History   Tobacco Use  . Smoking status: Never Smoker  . Smokeless tobacco: Never Used  Substance Use Topics  . Alcohol use: No  . Drug use: Not on file     Allergies   Patient has no known allergies.   Review of Systems Review of Systems  Constitutional: Negative for fever.  Respiratory:  Positive for shortness of breath (resolved).   Gastrointestinal: Positive for abdominal pain. Negative for nausea and vomiting.  All other systems reviewed and are negative.    Physical Exam Updated Vital Signs BP 112/78   Pulse (!) 59   Temp 98.6 F (37 C) (Oral)   Resp 19   SpO2 100%   Physical Exam  Constitutional: He appears well-developed and well-nourished. No distress.  HENT:  Head: Normocephalic and atraumatic.  Mouth/Throat: Oropharynx is clear and moist.  Eyes: Conjunctivae are normal.  Cardiovascular: Normal rate, regular rhythm, normal heart sounds and intact distal pulses.  Pulmonary/Chest: Effort normal and breath sounds normal. No respiratory distress.  Abdominal: Soft. Bowel sounds are normal. He exhibits no distension. There is no tenderness. There is no rebound and no guarding.  Neurological: He is alert.  Skin: Skin is warm.  Psychiatric: He has a normal mood and affect. His behavior is normal.  Nursing note and vitals reviewed.    ED Treatments / Results  Labs (all labs ordered are listed, but only abnormal results are displayed) Labs Reviewed - No data to display  EKG EKG Interpretation  Date/Time:  Tuesday September 20 2017 02:14:10 EDT Ventricular Rate:  72 PR Interval:  140 QRS Duration: 84 QT Interval:  368 QTC Calculation: 402 R Axis:   34 Text Interpretation:  Normal sinus rhythm Confirmed by Nicanor AlconPalumbo, April ([id number]) on 09/20/2017 6:25:19 AM  Radiology No results found.  Procedures Procedures (including critical care time)  Medications Ordered in ED Medications  dicyclomine (BENTYL) capsule 10 mg (has no administration in time range)  albuterol (PROVENTIL) (2.5 MG/3ML) 0.083% nebulizer solution 5 mg (5 mg Nebulization Given 09/20/17 0225)     Initial Impression / Assessment and Plan / ED Course  I have reviewed the triage vital signs and the nursing notes.  Pertinent labs & imaging results that were available during my care of the  patient were reviewed by me and considered in my medical decision making (see chart for details).     Pt presenting to the ED for stomach upset and abdominal cramping that began after eating pizza and hot wings yesterday. Pt reports this was his first solid meal follow GI virus that occurred over the weekend. Last BM was yesterday. Pt is afebrile, not in distress. Abdomen is soft and nontender. No evidence of acute intra-abdominal pathology. Pt declined labs in triage, though do not feel they are necessary. Discussed clear liquids and bland foods, instructed to avoid spicy foods and dairy for the next few days. Bentyl given in the ED for cramping. PCP follow up as needed, safe for discharge.  Discussed results, findings, treatment and follow up. Patient advised of return precautions. Patient verbalized understanding and agreed with plan.  Final Clinical Impressions(s) / ED Diagnoses   Final diagnoses:  Generalized abdominal pain    ED Discharge Orders    None       Robinson, [place] N, PA-C 09/20/17 1610    Linwood Dibbles, MD 09/20/17 [phone]

## 2017-09-20 NOTE — ED Triage Notes (Addendum)
Pt reports SOB for an hours, this is a new occurrence.  Pt was at urgent care yesterday for a stomach virus, continues to have abdominal pain.  Pt refused labs in triage.

## 2017-09-20 NOTE — Discharge Instructions (Signed)
Please read instructions below. Drink clear liquids until your stomach feels better. Then, slowly introduce bland foods into your diet as tolerated, such as bread, rice, apples, bananas. Avoid dairy, spicy foods, etc as they are harder to digest and can cause stomach upset. Follow up with your primary care if symptoms persist. Return to the ER for severe abdominal pain, fever, uncontrollable vomiting, or new or concerning symptoms.

## 2020-06-12 ENCOUNTER — Other Ambulatory Visit: Payer: Self-pay

## 2020-06-12 ENCOUNTER — Encounter (HOSPITAL_COMMUNITY): Payer: Self-pay

## 2020-06-12 ENCOUNTER — Ambulatory Visit (HOSPITAL_COMMUNITY)
Admission: EM | Admit: 2020-06-12 | Discharge: 2020-06-12 | Disposition: A | Discharge status: Home / Self Care | Payer: PRIVATE HEALTH INSURANCE | Attending: Urgent Care | Admitting: Urgent Care

## 2020-06-12 DIAGNOSIS — R300 Dysuria: Secondary | ICD-10-CM

## 2020-06-12 DIAGNOSIS — Z7251 High risk heterosexual behavior: Secondary | ICD-10-CM | POA: Insufficient documentation

## 2020-06-12 DIAGNOSIS — Z202 Contact with and (suspected) exposure to infections with a predominantly sexual mode of transmission: Secondary | ICD-10-CM | POA: Insufficient documentation

## 2020-06-12 MED ORDER — METRONIDAZOLE 500 MG PO TABS: 500.0000 mg | ORAL_TABLET | Freq: Two times a day (BID) | ORAL | 0 refills | Status: DC

## 2020-06-12 NOTE — ED Triage Notes (Signed)
Pt in with c/o burning during urination that has been going on for 3 days now  Requesting STI testing Denies penile discharge or urinary issues

## 2020-06-12 NOTE — ED Provider Notes (Signed)
  [facility]   MRN: [id number] DOB: [birth year]  Subjective:   Louis Hawkins is a 29 y.o. male presenting for 3-day history of dysuria. Patient had exposure to trichomoniasis. Has sex partner ~she tested positive for that. Denies fever, penile pain, testicle pain, penile discharge.  No current facility-administered medications for this encounter.  Current Outpatient Medications:  .  ondansetron (ZOFRAN) 4 MG tablet, Take 1 tablet (4 mg total) by mouth every 8 (eight) hours as needed for nausea or vomiting., Disp: 10 tablet, Rfl: 0   No Known Allergies  History reviewed. No pertinent past medical history.   History reviewed. No pertinent surgical history.  History reviewed. No pertinent family history.  Social History   Tobacco Use  . Smoking status: Never Smoker  . Smokeless tobacco: Never Used  Vaping Use  . Vaping Use: Never used  Substance Use Topics  . Alcohol use: No    ROS   Objective:   Vitals: BP 115/69 (BP Location: Right Arm)   Pulse 86   Temp 97.6 F (36.4 C) (Oral)   Resp 19   SpO2 100%   Physical Exam Constitutional:      General: He is not in acute distress.    Appearance: Normal appearance. He is well-developed and normal weight. He is not ill-appearing, toxic-appearing or diaphoretic.  HENT:     Head: Normocephalic and atraumatic.     Right Ear: External ear normal.     Left Ear: External ear normal.     Nose: Nose normal.     Mouth/Throat:     Pharynx: Oropharynx is clear.  Eyes:     General: No scleral icterus.       Right eye: No discharge.        Left eye: No discharge.     Extraocular Movements: Extraocular movements intact.     Pupils: Pupils are equal, round, and reactive to light.  Cardiovascular:     Rate and Rhythm: Normal rate.  Pulmonary:     Effort: Pulmonary effort is normal.  Genitourinary:    Penis: Circumcised. No phimosis, paraphimosis, hypospadias, erythema, tenderness, discharge, swelling or  lesions.   Musculoskeletal:     Cervical back: Normal range of motion.  Neurological:     Mental Status: He is alert and oriented to person, place, and time.  Psychiatric:        Mood and Affect: Mood normal.        Behavior: Behavior normal.        Thought Content: Thought content normal.        Judgment: Judgment normal.      Assessment and Plan :   PDMP not reviewed this encounter.  1. Dysuria   2. Exposure to trichomonas   3. Unprotected sex     Given his exposure, will cover for trichomoniasis with Flagyl. Labs pending. Recommended abstaining for 1 week as recommended by the CDC. Counseled patient on potential for adverse effects with medications prescribed/recommended today, ER and return-to-clinic precautions discussed, patient verbalized understanding.    Wallis Bamberg, PA-C 06/12/20 1425

## 2020-06-12 NOTE — Discharge Instructions (Signed)
Avoid all forms of sexual intercourse (oral, vaginal, anal) for the next 7 days to avoid spreading/reinfecting. Return if symptoms worsen/do not resolve, you develop fever, abdominal pain, blood in your urine, or are re-exposed to an STI.  

## 2020-06-17 LAB — CYTOLOGY, (ORAL, ANAL, URETHRAL) ANCILLARY ONLY
Chlamydia: NEGATIVE
Comment: NEGATIVE
Comment: NEGATIVE
Comment: NORMAL
Neisseria Gonorrhea: NEGATIVE
Trichomonas: NEGATIVE

## 2020-07-23 ENCOUNTER — Ambulatory Visit (HOSPITAL_COMMUNITY)
Admission: RE | Admit: 2020-07-23 | Discharge: 2020-07-23 | Disposition: A | Discharge status: Home / Self Care | Payer: Self-pay | Source: Ambulatory Visit | Attending: Emergency Medicine | Admitting: Emergency Medicine

## 2020-07-23 ENCOUNTER — Encounter (HOSPITAL_COMMUNITY): Payer: Self-pay

## 2020-07-23 ENCOUNTER — Other Ambulatory Visit: Payer: Self-pay

## 2020-07-23 VITALS — BP 114/74 | HR 104 | Temp 98.7°F | Resp 16

## 2020-07-23 DIAGNOSIS — R300 Dysuria: Secondary | ICD-10-CM | POA: Insufficient documentation

## 2020-07-23 DIAGNOSIS — R369 Urethral discharge, unspecified: Secondary | ICD-10-CM | POA: Insufficient documentation

## 2020-07-23 MED ORDER — CEFTRIAXONE SODIUM 500 MG IJ SOLR
INTRAMUSCULAR | Status: AC
  Filled 2020-07-23: qty 500

## 2020-07-23 MED ORDER — LIDOCAINE HCL (PF) 1 % IJ SOLN
INTRAMUSCULAR | Status: AC
  Filled 2020-07-23: qty 2

## 2020-07-23 MED ORDER — DOXYCYCLINE HYCLATE 100 MG PO CAPS: 100.0000 mg | ORAL_CAPSULE | Freq: Two times a day (BID) | ORAL | 0 refills | Status: DC

## 2020-07-23 MED ORDER — CEFTRIAXONE SODIUM 500 MG IJ SOLR
500.0000 mg | Freq: Once | INTRAMUSCULAR | Status: AC
  Administered 2020-07-23: 500 mg via INTRAMUSCULAR

## 2020-07-23 NOTE — Discharge Instructions (Signed)
We are starting treatment due to concern for std's given your symptoms.  We will notify of you any positive findings or if any changes to treatment are needed. If normal or otherwise without concern to your results, we will not call you. Please log on to your MyChart to review your results if interested in so.   Please withhold from intercourse for the next week. Please use condoms to prevent STD's.

## 2020-07-23 NOTE — ED Provider Notes (Signed)
[facility]    CSN: [id number] Arrival date & time: 07/23/20  1401      History   Chief Complaint Chief Complaint  Patient presents with  . Exposure to STD    HPI Louis Hawkins is a 30 y.o. male.   Louis Hawkins presents with complaints of penile discharge and dysuria which started a few days ago. No pelvic pain. No fevers. No urinary frequency. Sexually active with 1 partner who he states is also seeking evaluation for symptoms. Doesn't use condoms. Has had std in the past but was years ago. Declines hiv/rpr testing.    ROS per HPI, negative if not otherwise mentioned.      History reviewed. No pertinent past medical history.  There are no problems to display for this patient.   History reviewed. No pertinent surgical history.     Home Medications    Prior to Admission medications   Medication Sig Start Date End Date Taking? Authorizing Provider  doxycycline (VIBRAMYCIN) 100 MG capsule Take 1 capsule (100 mg total) by mouth 2 (two) times daily. 07/23/20  Yes Burky, Barron Alvine, NP  metroNIDAZOLE (FLAGYL) 500 MG tablet Take 1 tablet (500 mg total) by mouth 2 (two) times daily with a meal. DO NOT CONSUME ALCOHOL WHILE TAKING THIS MEDICATION. 06/12/20   Wallis Bamberg, PA-C  ondansetron (ZOFRAN) 4 MG tablet Take 1 tablet (4 mg total) by mouth every 8 (eight) hours as needed for nausea or vomiting. 09/17/17   Georgetta Haber, NP    Family History History reviewed. No pertinent family history.  Social History Social History   Tobacco Use  . Smoking status: Never Smoker  . Smokeless tobacco: Never Used  Vaping Use  . Vaping Use: Never used  Substance Use Topics  . Alcohol use: No     Allergies   Patient has no known allergies.   Review of Systems Review of Systems   Physical Exam Triage Vital Signs ED Triage Vitals [07/23/20 1422]  Enc Vitals Group     BP 114/74     Pulse Rate (!) 104     Resp 16     Temp 98.7 F (37.1 C)     Temp Source Oral      SpO2 99 %     Weight      Height      Head Circumference      Peak Flow      Pain Score 0     Pain Loc      Pain Edu?      Excl. in GC?    No data found.  Updated Vital Signs BP 114/74 (BP Location: Left Arm)   Pulse (!) 104   Temp 98.7 F (37.1 C) (Oral)   Resp 16   SpO2 97%   Visual Acuity Right Eye Distance:   Left Eye Distance:   Bilateral Distance:    Right Eye Near:   Left Eye Near:    Bilateral Near:     Physical Exam Constitutional:      Appearance: He is well-developed.  Cardiovascular:     Rate and Rhythm: Normal rate.  Pulmonary:     Effort: Pulmonary effort is normal.  Abdominal:     Palpations: Abdomen is not rigid.     Tenderness: Negative signs include Murphy's sign and McBurney's sign.     Comments: Denies scrotal redness, swelling, pain; denies sores or lesions; gu exam deferred   Skin:    General: Skin is  warm and dry.  Neurological:     Mental Status: He is alert and oriented to person, place, and time.      UC Treatments / Results  Labs (all labs ordered are listed, but only abnormal results are displayed) Labs Reviewed  CYTOLOGY, (ORAL, ANAL, URETHRAL) ANCILLARY ONLY    EKG   Radiology No results found.  Procedures Procedures (including critical care time)  Medications Ordered in UC Medications  cefTRIAXone (ROCEPHIN) injection 500 mg (has no administration in time range)    Initial Impression / Assessment and Plan / UC Course  I have reviewed the triage vital signs and the nursing notes.  Pertinent labs & imaging results that were available during my care of the patient were reviewed by me and considered in my medical decision making (see chart for details).  Clinical Course as of 07/23/20 1443  Wed Jul 23, 2020  1443 Cytology (oral, anal, urethral) ancillary only [NB]    Clinical Course User Index [NB] Georgetta Haber, NP    Penile discharge and dysuria. Empiric sti treatment started with rocephin and doxy  pending penile cytology. Safe sex encouraged. Patient verbalized understanding and agreeable to plan.   Final Clinical Impressions(s) / UC Diagnoses   Final diagnoses:  Penile discharge  Dysuria     Discharge Instructions     We are starting treatment due to concern for std's given your symptoms.  We will notify of you any positive findings or if any changes to treatment are needed. If normal or otherwise without concern to your results, we will not call you. Please log on to your MyChart to review your results if interested in so.   Please withhold from intercourse for the next week. Please use condoms to prevent STD's.     ED Prescriptions    Medication Sig Dispense Auth. Provider   doxycycline (VIBRAMYCIN) 100 MG capsule Take 1 capsule (100 mg total) by mouth 2 (two) times daily. 20 capsule Georgetta Haber, NP     PDMP not reviewed this encounter.   Georgetta Haber, NP 07/23/20 1443

## 2020-07-23 NOTE — ED Triage Notes (Signed)
Pt presents today with Dysuria and penial discharge for 2 days.

## 2020-07-24 ENCOUNTER — Telehealth (HOSPITAL_COMMUNITY): Payer: Self-pay | Admitting: Emergency Medicine

## 2020-07-24 LAB — CYTOLOGY, (ORAL, ANAL, URETHRAL) ANCILLARY ONLY
Chlamydia: NEGATIVE
Comment: NEGATIVE
Comment: NEGATIVE
Comment: NORMAL
Neisseria Gonorrhea: NEGATIVE
Trichomonas: POSITIVE — AB

## 2020-07-24 MED ORDER — METRONIDAZOLE 500 MG PO TABS: 500.0000 mg | ORAL_TABLET | Freq: Two times a day (BID) | ORAL | 0 refills | Status: DC

## 2020-09-13 ENCOUNTER — Ambulatory Visit (HOSPITAL_COMMUNITY): Payer: Self-pay

## 2020-09-19 ENCOUNTER — Other Ambulatory Visit: Payer: Self-pay

## 2020-09-19 ENCOUNTER — Ambulatory Visit
Admission: RE | Admit: 2020-09-19 | Discharge: 2020-09-19 | Disposition: A | Discharge status: Home / Self Care | Payer: Self-pay | Source: Ambulatory Visit | Attending: Family Medicine | Admitting: Family Medicine

## 2020-09-19 VITALS — BP 106/69 | HR 81 | Temp 98.4°F | Resp 18

## 2020-09-19 DIAGNOSIS — Z202 Contact with and (suspected) exposure to infections with a predominantly sexual mode of transmission: Secondary | ICD-10-CM | POA: Insufficient documentation

## 2020-09-19 MED ORDER — METRONIDAZOLE 500 MG PO TABS: 500.0000 mg | ORAL_TABLET | Freq: Two times a day (BID) | ORAL | 0 refills | Status: DC

## 2020-09-19 NOTE — ED Triage Notes (Signed)
Pt reports dysuria and penile discharge x 3 days.  Had unprotected intercourse a few days ago as well.

## 2020-09-19 NOTE — ED Provider Notes (Signed)
Louis Hawkins    CSN: [id number] Arrival date & time: 09/19/20  1108      History   Chief Complaint Chief Complaint  Patient presents with  . Penile Discharge    HPI Louis Hawkins is a 30 y.o. male.   Patient is a 30 year old male who presents today with penile discharge, itching, burning.  This is been present for 3 days.  Was possibly exposed to trichomonas.  No fevers, chills, testicle pain, testicle swelling, stomach pain. Treated for trichomonas 2 months ago.      History reviewed. No pertinent past medical history.  There are no problems to display for this patient.   History reviewed. No pertinent surgical history.     Home Medications    Prior to Admission medications   Medication Sig Start Date End Date Taking? Authorizing Provider  metroNIDAZOLE (FLAGYL) 500 MG tablet Take 1 tablet (500 mg total) by mouth 2 (two) times daily. 09/19/20  Yes Janace Aris, NP    Family History Family History  Problem Relation Age of Onset  . Healthy Mother   . Healthy Father     Social History Social History   Tobacco Use  . Smoking status: Light Tobacco Smoker    Types: Cigars  . Smokeless tobacco: Never Used  Vaping Use  . Vaping Use: Never used  Substance Use Topics  . Alcohol use: Yes    Comment: occ  . Drug use: Yes    Frequency: 7.0 times per week    Types: Marijuana     Allergies   Patient has no known allergies.   Review of Systems Review of Systems   Physical Exam Triage Vital Signs ED Triage Vitals  Enc Vitals Group     BP      Pulse      Resp      Temp      Temp src      SpO2      Weight      Height      Head Circumference      Peak Flow      Pain Score      Pain Loc      Pain Edu?      Excl. in GC?    No data found.  Updated Vital Signs BP 106/69 (BP Location: Left Arm)   Pulse 81   Temp 98.4 F (36.9 C) (Oral)   Resp 18   SpO2 98%   Visual Acuity Right Eye Distance:   Left Eye Distance:   Bilateral  Distance:    Right Eye Near:   Left Eye Near:    Bilateral Near:     Physical Exam Vitals and nursing note reviewed.  Constitutional:      Appearance: Normal appearance.  HENT:     Head: Normocephalic and atraumatic.  Eyes:     Conjunctiva/sclera: Conjunctivae normal.  Pulmonary:     Effort: Pulmonary effort is normal.  Musculoskeletal:        General: Normal range of motion.     Cervical back: Normal range of motion.  Skin:    General: Skin is warm and dry.  Neurological:     Mental Status: He is alert.  Psychiatric:        Mood and Affect: Mood normal.      UC Treatments / Results  Labs (all labs ordered are listed, but only abnormal results are displayed) Labs Reviewed  CYTOLOGY, (ORAL, ANAL, URETHRAL) ANCILLARY ONLY  EKG   Radiology No results found.  Procedures Procedures (including critical care time)  Medications Ordered in UC Medications - No data to display  Initial Impression / Assessment and Plan / UC Course  I have reviewed the triage vital signs and the nursing notes.  Pertinent labs & imaging results that were available during my care of the patient were reviewed by me and considered in my medical decision making (see chart for details).     STD exposure Patient believes he was exposed to trichomonas.  Based on symptoms, history and exposure we will go ahead and treat prophylactically today.  Flagyl sent to the pharmacy. Swab sent for further testing Final Clinical Impressions(s) / UC Diagnoses   Final diagnoses:  Exposure to STD     Discharge Instructions     Treating you for trichomoniasis.  Medicine sent to pharmacy Do not drink any alcohol while taking this medication. You can check your MyChart for swab results     ED Prescriptions    Medication Sig Dispense Auth. Provider   metroNIDAZOLE (FLAGYL) 500 MG tablet Take 1 tablet (500 mg total) by mouth 2 (two) times daily. 14 tablet Jakhai Fant A, NP     PDMP not  reviewed this encounter.   Janace Aris, NP 09/19/20 1147

## 2020-09-19 NOTE — Discharge Instructions (Addendum)
Treating you for trichomoniasis.  Medicine sent to pharmacy Do not drink any alcohol while taking this medication. You can check your MyChart for swab results

## 2020-09-22 LAB — CYTOLOGY, (ORAL, ANAL, URETHRAL) ANCILLARY ONLY
Chlamydia: POSITIVE — AB
Comment: NEGATIVE
Comment: NEGATIVE
Comment: NORMAL
Neisseria Gonorrhea: NEGATIVE
Trichomonas: POSITIVE — AB

## 2020-09-23 ENCOUNTER — Telehealth (HOSPITAL_COMMUNITY): Payer: Self-pay | Admitting: Emergency Medicine

## 2020-09-23 MED ORDER — DOXYCYCLINE HYCLATE 100 MG PO CAPS: 100.0000 mg | ORAL_CAPSULE | Freq: Two times a day (BID) | ORAL | 0 refills | Status: AC

## 2021-03-19 ENCOUNTER — Ambulatory Visit
Admission: RE | Admit: 2021-03-19 | Discharge: 2021-03-19 | Disposition: A | Discharge status: Home / Self Care | Payer: No Typology Code available for payment source | Source: Ambulatory Visit | Attending: Emergency Medicine | Admitting: Emergency Medicine

## 2021-03-19 ENCOUNTER — Other Ambulatory Visit: Payer: Self-pay

## 2021-03-19 VITALS — BP 112/72 | HR 88 | Temp 98.9°F | Resp 18

## 2021-03-19 DIAGNOSIS — Z113 Encounter for screening for infections with a predominantly sexual mode of transmission: Secondary | ICD-10-CM | POA: Insufficient documentation

## 2021-03-19 MED ORDER — AZITHROMYCIN 500 MG PO TABS: 1000.0000 mg | ORAL_TABLET | Freq: Once | ORAL | 0 refills | Status: AC

## 2021-03-19 MED ORDER — METRONIDAZOLE 500 MG PO TABS: 2000.0000 mg | ORAL_TABLET | Freq: Once | ORAL | 0 refills | Status: AC

## 2021-03-19 MED ORDER — CEFTRIAXONE SODIUM 500 MG IJ SOLR
500.0000 mg | Freq: Once | INTRAMUSCULAR | Status: AC
  Administered 2021-03-19: 500 mg via INTRAMUSCULAR

## 2021-03-19 NOTE — ED Provider Notes (Signed)
SUBJECTIVE:  Louis Hawkins is a very pleasant 30 y.o. Male presents with concern for STD due to some discomfort with urination which started 2 days ago.  Patient does not report any discharge, penile pain, testicular discomfort, fever or chills.  He would like treatment for gonorrhea, chlamydia and trichomonas.  ROS: General/Constitutional: No fever, chills, or sweats GI: No abdominal pain, nausea/vomiting or diarrhea GU: As above Genitalia: As above Lymph: No swelling, red streaks or swollen lymph nodes Skin: No rashes or skin lesions   OBJECTIVE: Vitals:   03/19/21 1618  BP: 112/72  Pulse: 88  Resp: 18  Temp: 98.9 F (37.2 C)  SpO2: 98%     General: Appears well-developed and well-nourished. No acute distress.  Cardiovascular: Normal rate Pulm/Chest: No respiratory distress Neurological: Alert and oriented to person, place, and time.  Skin: Skin is warm and dry.  Psychiatric: Normal mood, affect, behavior, and thought content.  Genitalia: Deferred secondary to self collect specimen  Laboratory:  No orders of the defined types were placed in this encounter.  No results found for any visits on 03/19/21.   Assessment: 1. Screen for STD (sexually transmitted disease) - Cytology (oral, anal, urethral) ancillary only; Standing - Cytology (oral, anal, urethral) ancillary only - cefTRIAXone (ROCEPHIN) injection 500 mg - metroNIDAZOLE (FLAGYL) 500 MG tablet; Take 4 tablets (2,000 mg total) by mouth once for 1 dose.  Dispense: 4 tablet; Refill: 0 - azithromycin (ZITHROMAX) 500 MG tablet; Take 2 tablets (1,000 mg total) by mouth once for 1 dose.  Dispense: 2 tablet; Refill: 0  Plan:  If new medications were prescribed and/or administered during this visit Instructions about new medications and side effects provided.  If any changes in chronic medications then new reconciled medication list is given to patient.    MDM: Patient presents with concern for STD due to some discomfort  with urination which started 2 days ago.  Patient does not report any discharge, penile pain, testicular discomfort, fever or chills.  He would like treatment for gonorrhea, chlamydia and trichomonas.  Swab obtained in clinic today.  The patient elects to be treated prophylactically for STDs.  He received an injection of Rocephin in clinic today for which he tolerated well.  Rx'd azithromycin and metronidazole to the patient's preferred pharmacy to cover for chlamydia and trichomonas.  Advised that we will call with positive results and negative results will be uploaded directly to his MyChart account.  Advised that he does not need to engage in sex until he knows his results and to abstain from sex if positive for at least 7 days and to notify all partners.  Patient verbalized understanding and agreed with plan.  Patient stable upon discharge.  Return as needed.  Instructions:    Discharge Instructions      Your testing has been sent to the lab.  If your testing results as positive, you will be notified via phone and we will initiate treatment at that time.  If you do not receive a phone call from [place] within the next 2 to 3 days, check your MyChart for updated health information. Do not engage in sex until you know your results. If you test positive you will need to abstain from sex for 7 days and notify all partners.  You have been prescribed a one-time dose of azithromycin and metronidazole to your pharmacy. Please pick up these medications and take them to cover for potential STD.         Shadawn Hanaway, Belenda Cruise,  FNP 03/19/21 1647

## 2021-03-19 NOTE — ED Triage Notes (Signed)
Complains of burning with urination, denies discharge, symptoms started 2 days ago

## 2021-03-19 NOTE — Discharge Instructions (Addendum)
Your testing has been sent to the lab.  If your testing results as positive, you will be notified via phone and we will initiate treatment at that time.  If you do not receive a phone call from [place] within the next 2 to 3 days, check your MyChart for updated health information. Do not engage in sex until you know your results. If you test positive you will need to abstain from sex for 7 days and notify all partners.  You have been prescribed a one-time dose of azithromycin and metronidazole to your pharmacy. Please pick up these medications and take them to cover for potential STD.

## 2021-03-20 LAB — CYTOLOGY, (ORAL, ANAL, URETHRAL) ANCILLARY ONLY
Chlamydia: NEGATIVE
Comment: NEGATIVE
Comment: NEGATIVE
Comment: NORMAL
Neisseria Gonorrhea: NEGATIVE
Trichomonas: NEGATIVE

## 2022-08-16 ENCOUNTER — Emergency Department (HOSPITAL_COMMUNITY)
Admission: EM | Admit: 2022-08-16 | Discharge: 2022-08-16 | Disposition: A | Discharge status: Home / Self Care | Payer: Self-pay | Attending: Emergency Medicine | Admitting: Emergency Medicine

## 2022-08-16 ENCOUNTER — Emergency Department (HOSPITAL_COMMUNITY): Payer: Self-pay

## 2022-08-16 ENCOUNTER — Encounter (HOSPITAL_COMMUNITY): Payer: Self-pay

## 2022-08-16 ENCOUNTER — Other Ambulatory Visit: Payer: Self-pay

## 2022-08-16 DIAGNOSIS — R197 Diarrhea, unspecified: Secondary | ICD-10-CM | POA: Insufficient documentation

## 2022-08-16 DIAGNOSIS — R112 Nausea with vomiting, unspecified: Secondary | ICD-10-CM | POA: Insufficient documentation

## 2022-08-16 DIAGNOSIS — R1084 Generalized abdominal pain: Secondary | ICD-10-CM | POA: Insufficient documentation

## 2022-08-16 LAB — I-STAT CHEM 8, ED
BUN: 12 mg/dL (ref 6–20)
Calcium, Ion: 1.2 mmol/L (ref 1.15–1.40)
Chloride: 105 mmol/L (ref 98–111)
Creatinine, Ser: 1.4 mg/dL — ABNORMAL HIGH (ref 0.61–1.24)
Glucose, Bld: 113 mg/dL — ABNORMAL HIGH (ref 70–99)
HCT: 47 % (ref 39.0–52.0)
Hemoglobin: 16 g/dL (ref 13.0–17.0)
Potassium: 3.8 mmol/L (ref 3.5–5.1)
Sodium: 142 mmol/L (ref 135–145)
TCO2: 25 mmol/L (ref 22–32)

## 2022-08-16 LAB — COMPREHENSIVE METABOLIC PANEL
ALT: 15 U/L (ref 0–44)
AST: 30 U/L (ref 15–41)
Albumin: 4.5 g/dL (ref 3.5–5.0)
Alkaline Phosphatase: 79 U/L (ref 38–126)
Anion gap: 11 (ref 5–15)
BUN: 11 mg/dL (ref 6–20)
CO2: 22 mmol/L (ref 22–32)
Calcium: 9.5 mg/dL (ref 8.9–10.3)
Chloride: 106 mmol/L (ref 98–111)
Creatinine, Ser: 1.34 mg/dL — ABNORMAL HIGH (ref 0.61–1.24)
GFR, Estimated: >60 mL/min (ref >60)
Glucose, Bld: 115 mg/dL — ABNORMAL HIGH (ref 70–99)
Potassium: 3.8 mmol/L (ref 3.5–5.1)
Sodium: 139 mmol/L (ref 135–145)
Total Bilirubin: 0.5 mg/dL (ref 0.3–1.2)
Total Protein: 7.5 g/dL (ref 6.5–8.1)

## 2022-08-16 LAB — CBC
HCT: 45.2 % (ref 39.0–52.0)
Hemoglobin: 14.7 g/dL (ref 13.0–17.0)
MCH: 29.5 pg (ref 26.0–34.0)
MCHC: 32.5 g/dL (ref 30.0–36.0)
MCV: 90.6 fL (ref 80.0–100.0)
Platelets: 207 10*3/uL (ref 150–400)
RBC: 4.99 MIL/uL (ref 4.22–5.81)
RDW: 13.2 % (ref 11.5–15.5)
WBC: 15.2 10*3/uL — ABNORMAL HIGH (ref 4.0–10.5)
nRBC: 0 % (ref 0.0–0.2)

## 2022-08-16 LAB — LIPASE, BLOOD: Lipase: 54 U/L — ABNORMAL HIGH (ref 11–51)

## 2022-08-16 MED ORDER — DROPERIDOL 2.5 MG/ML IJ SOLN
2.5000 mg | Freq: Once | INTRAMUSCULAR | Status: AC
  Administered 2022-08-16: 2.5 mg via INTRAVENOUS
  Filled 2022-08-16: qty 2

## 2022-08-16 MED ORDER — ONDANSETRON HCL 8 MG PO TABS: 8.0000 mg | ORAL_TABLET | Freq: Three times a day (TID) | ORAL | 0 refills | Status: AC | PRN

## 2022-08-16 MED ORDER — FENTANYL CITRATE PF 50 MCG/ML IJ SOSY
50.0000 ug | PREFILLED_SYRINGE | Freq: Once | INTRAMUSCULAR | Status: AC
  Administered 2022-08-16: 50 ug via INTRAVENOUS
  Filled 2022-08-16: qty 1

## 2022-08-16 MED ORDER — IOHEXOL 350 MG/ML SOLN
75.0000 mL | Freq: Once | INTRAVENOUS | Status: AC | PRN
  Administered 2022-08-16: 75 mL via INTRAVENOUS

## 2022-08-16 MED ORDER — ONDANSETRON HCL 4 MG/2ML IJ SOLN
4.0000 mg | Freq: Once | INTRAMUSCULAR | Status: AC
  Administered 2022-08-16: 4 mg via INTRAVENOUS
  Filled 2022-08-16: qty 2

## 2022-08-16 MED ORDER — LACTATED RINGERS IV BOLUS
1000.0000 mL | Freq: Once | INTRAVENOUS | Status: AC
  Administered 2022-08-16: 1000 mL via INTRAVENOUS

## 2022-08-16 NOTE — ED Provider Notes (Signed)
[facility] Provider Note   CSN: [id number] Arrival date & time: 08/16/22  [id number]     History  Chief Complaint  Patient presents with   Abdominal Pain    Louis Hawkins is a 32 y.o. male.  HPI 32 year old male presents today complaining of nausea vomiting and diarrhea.  This started about an hour prior to arrival.  He reports that he had an episode of emesis that had some blood mixed in with it.  He has not had repeat bleeding has not had any bloody stools.  He did have some loose stools.  He feels that this is secondary to food poisoning after he ate some restaurant food last night.  He has not had any known sick contacts.  He has not had fever or chills.  He denies alcohol abuse.  He denies any similar previous episodes.     Home Medications Prior to Admission medications   Medication Sig Start Date End Date Taking? Authorizing Provider  ondansetron (ZOFRAN) 8 MG tablet Take 1 tablet (8 mg total) by mouth every 8 (eight) hours as needed for nausea or vomiting. 08/16/22  Yes Pattricia Boss, MD      Allergies    Patient has no known allergies.    Review of Systems   Review of Systems  Physical Exam Updated Vital Signs BP (!) 93/59   Pulse 86   Temp 98.6 F (37 C) (Oral)   Resp 17   SpO2 100%  Physical Exam Vitals and nursing note reviewed.  Constitutional:      Appearance: He is well-developed.  HENT:     Head: Normocephalic and atraumatic.     Right Ear: External ear normal.     Left Ear: External ear normal.     Nose: Nose normal.  Eyes:     Conjunctiva/sclera: Conjunctivae normal.     Pupils: Pupils are equal, round, and reactive to light.  Cardiovascular:     Rate and Rhythm: Normal rate and regular rhythm.     Heart sounds: Normal heart sounds.  Pulmonary:     Effort: Pulmonary effort is normal.     Breath sounds: Normal breath sounds.  Abdominal:     General: Bowel sounds are normal.     Palpations: Abdomen is  soft.     Tenderness: There is generalized abdominal tenderness.  Musculoskeletal:        General: Normal range of motion.     Cervical back: Normal range of motion and neck supple.  Skin:    General: Skin is warm and dry.  Neurological:     General: No focal deficit present.     Mental Status: He is alert and oriented to person, place, and time. Mental status is at baseline.     Sensory: No sensory deficit.     Motor: No weakness.     Gait: Gait normal.     Deep Tendon Reflexes: Reflexes are normal and symmetric. Reflexes normal.  Psychiatric:        Behavior: Behavior normal.        Thought Content: Thought content normal.        Judgment: Judgment normal.     ED Results / Procedures / Treatments   Labs (all labs ordered are listed, but only abnormal results are displayed) Labs Reviewed  CBC - Abnormal; Notable for the following components:      Result Value   WBC 15.2 (*)    All other components  within normal limits  COMPREHENSIVE METABOLIC PANEL - Abnormal; Notable for the following components:   Glucose, Bld 115 (*)    Creatinine, Ser 1.34 (*)    All other components within normal limits  LIPASE, BLOOD - Abnormal; Notable for the following components:   Lipase 54 (*)    All other components within normal limits  I-STAT CHEM 8, ED - Abnormal; Notable for the following components:   Creatinine, Ser 1.40 (*)    Glucose, Bld 113 (*)    All other components within normal limits  URINALYSIS, ROUTINE W REFLEX MICROSCOPIC    EKG None  Radiology CT ABDOMEN PELVIS W CONTRAST  Result Date: 08/16/2022 CLINICAL DATA:  Left lower quadrant abdominal pain with nausea and vomiting. History of nephrolithiasis EXAM: CT ABDOMEN AND PELVIS WITH CONTRAST TECHNIQUE: Multidetector CT imaging of the abdomen and pelvis was performed using the standard protocol following bolus administration of intravenous contrast. RADIATION DOSE REDUCTION: This exam was performed according to the  departmental dose-optimization program which includes automated exposure control, adjustment of the mA and/or kV according to patient size and/or use of iterative reconstruction technique. CONTRAST:  33m OMNIPAQUE IOHEXOL 350 MG/ML SOLN COMPARISON:  03/03/2015 FINDINGS: Lower chest: No acute abnormality. Hepatobiliary: Trace amount of focal fatty infiltration of the liver along the falciform ligament anteriorly. Tiny subcentimeter peripheral right hepatic hypodensity, too small to definitively characterize but suspect small cyst, image 23/3. Hepatic and portal veins are patent. No biliary dilatation. No other focal hepatic abnormality. Gallbladder unremarkable. Common bile duct nondilated. Pancreas: Unremarkable. No pancreatic ductal dilatation or surrounding inflammatory changes. Spleen: Normal in size without focal abnormality. Adrenals/Urinary Tract: Normal adrenal glands. Scattered cortical subcentimeter renal hypodense cysts. No further imaging recommended. No acute obstructive uropathy, hydronephrosis, perinephric inflammatory process, or hydroureter. No obstructing urinary tract calculi. Bladder unremarkable. Stomach/Bowel: Negative for bowel obstruction, significant dilatation, ileus, or free air. Low lying stool filled cecum in the pelvis. Normal appendix visualized containing air along the right hemipelvis. No free fluid, fluid collection, hemorrhage, hematoma, abscess or ascites. Vascular/Lymphatic: No significant vascular findings are present. No enlarged abdominal or pelvic lymph nodes. Reproductive: No significant finding by CT. Stable postop changes in the left inguinal area with a trace amount of fluid in the left inguinal scrotal canal. Other: No abdominal wall hernia or abnormality. No abdominopelvic ascites. Musculoskeletal: No acute or significant osseous findings. IMPRESSION: 1. No acute intra-abdominal or pelvic finding by CT. 2. Stable postop changes in the left inguinal area with a trace  amount of fluid in the left inguinal scrotal canal. Electronically Signed   By: MJerilynn Mages  Shick M.D.   On: 08/16/2022 11:56    Procedures Procedures    Medications Ordered in ED Medications  ondansetron (ZOFRAN) injection 4 mg (4 mg Intravenous Given 08/16/22 0848)  fentaNYL (SUBLIMAZE) injection 50 mcg (50 mcg Intravenous Given 08/16/22 0847)  lactated ringers bolus 1,000 mL (0 mLs Intravenous Stopped 08/16/22 1240)  droperidol (INAPSINE) 2.5 MG/ML injection 2.5 mg (2.5 mg Intravenous Given 08/16/22 0940)  iohexol (OMNIPAQUE) 350 MG/ML injection 75 mL (75 mLs Intravenous Contrast Given 08/16/22 1124)    ED Course/ Medical Decision Making/ A&P Clinical Course as of 08/16/22 1245  Mon Aug 16, 2022  1244 CBC reviewed interpreted with leukocytosis otherwise within normal limits Patient with mildly elevated lipase. Creatinine increased to 1.34.  This has been elevated over the past several.  Patient is advised regarding need for outpatient follow-up. [DR]    Clinical Course User Index [DR] RPattricia Boss  MD                             Medical Decision Making Amount and/or Complexity of Data Reviewed Labs: ordered. Radiology: ordered.  Risk Prescription drug management.   32 year old male presents today with nausea vomiting diarrhea.  Here in the ED received IV fluids antiemetics.  Is now tolerating liquids.  He was evaluated with labs and with CT scan.  No evidence of acute abnormalities noted on CT scan.  No evidence of acute electrolyte abnormality.  Patient wishes discharge she is discharged home with Zofran and return precautions voices understanding.        Final Clinical Impression(s) / ED Diagnoses Final diagnoses:  Generalized abdominal pain  Nausea vomiting and diarrhea    Rx / DC Orders ED Discharge Orders          Ordered    ondansetron (ZOFRAN) 8 MG tablet  Every 8 hours PRN        08/16/22 1242              Pattricia Boss, MD 08/16/22 1245

## 2022-08-16 NOTE — ED Notes (Signed)
Patient tolerating drinking water without nausea or emesis

## 2022-08-16 NOTE — ED Triage Notes (Signed)
From home. Abdominal pain, LLQ radiates to rest of abdomen. Nausea, vomiting. Started 1 hour ago. Pt reports bright red blood in emesis, no blood visualized in emesis in the current emesis bag. Hx of kidney stones, feels similar, but no back or flank pain.

## 2022-08-16 NOTE — Discharge Instructions (Addendum)
Evaluated here in the ED for nausea vomiting diarrhea and abdominal pain. Creatinine is somewhat elevated today and has been in the past. Please follow-up with your doctor this week Drink plenty of fluids. Return to the ED if you are having worsening pain or inability to tolerate fluids or other new symptoms.

## 2022-09-11 ENCOUNTER — Other Ambulatory Visit: Payer: Self-pay

## 2022-09-11 ENCOUNTER — Emergency Department: Payer: No Typology Code available for payment source

## 2022-09-11 ENCOUNTER — Encounter: Payer: Self-pay | Admitting: Intensive Care

## 2022-09-11 ENCOUNTER — Emergency Department
Admission: EM | Admit: 2022-09-11 | Discharge: 2022-09-11 | Disposition: A | Discharge status: Home / Self Care | Payer: No Typology Code available for payment source | Attending: Emergency Medicine | Admitting: Emergency Medicine

## 2022-09-11 DIAGNOSIS — F17290 Nicotine dependence, other tobacco product, uncomplicated: Secondary | ICD-10-CM | POA: Diagnosis not present

## 2022-09-11 DIAGNOSIS — S4990XA Unspecified injury of shoulder and upper arm, unspecified arm, initial encounter: Secondary | ICD-10-CM | POA: Diagnosis present

## 2022-09-11 DIAGNOSIS — S3992XA Unspecified injury of lower back, initial encounter: Secondary | ICD-10-CM | POA: Insufficient documentation

## 2022-09-11 DIAGNOSIS — T148XXA Other injury of unspecified body region, initial encounter: Secondary | ICD-10-CM | POA: Insufficient documentation

## 2022-09-11 MED ORDER — CYCLOBENZAPRINE HCL 5 MG PO TABS: 5.0000 mg | ORAL_TABLET | Freq: Three times a day (TID) | ORAL | 0 refills | Status: AC | PRN
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
## 2022-10-28 ENCOUNTER — Ambulatory Visit: Payer: Self-pay

## 2022-10-28 ENCOUNTER — Ambulatory Visit
Admission: RE | Admit: 2022-10-28 | Discharge: 2022-10-28 | Disposition: A | Discharge status: Home / Self Care | Payer: Self-pay | Source: Ambulatory Visit | Attending: Urgent Care | Admitting: Urgent Care

## 2022-10-28 VITALS — BP 118/72 | HR 88 | Temp 98.1°F | Resp 18

## 2022-10-28 DIAGNOSIS — Z202 Contact with and (suspected) exposure to infections with a predominantly sexual mode of transmission: Secondary | ICD-10-CM | POA: Insufficient documentation

## 2022-10-28 LAB — POCT URINALYSIS DIP (MANUAL ENTRY)
Bilirubin, UA: NEGATIVE
Blood, UA: NEGATIVE
Glucose, UA: NEGATIVE mg/dL
Ketones, POC UA: NEGATIVE mg/dL
Leukocytes, UA: NEGATIVE
Nitrite, UA: NEGATIVE
Protein Ur, POC: NEGATIVE mg/dL
Spec Grav, UA: >=1.03 — AB (ref 1.010–1.025)
Urobilinogen, UA: 0.2 E.U./dL
pH, UA: 6 (ref 5.0–8.0)

## 2022-10-28 MED ORDER — METRONIDAZOLE 500 MG PO TABS: 2000.0000 mg | ORAL_TABLET | Freq: Once | ORAL | 0 refills | Status: AC

## 2022-10-28 NOTE — ED Triage Notes (Signed)
Patient presents to [facility] for STD testing. Reports dysuria x 2 days ago. Also states his GF tested positive for Trich.

## 2022-10-28 NOTE — ED Provider Notes (Signed)
Renaldo Fiddler    CSN: [id number] Arrival date & time: 10/28/22  1439      History   Chief Complaint Chief Complaint  Patient presents with   SEXUALLY TRANSMITTED DISEASE    STD checking - Entered by patient    HPI Louis Hawkins is a 32 y.o. male.   HPI  Patient presents urgent care to request STD testing.  He reports dysuria x 2 days.  Also reports that his girlfriend tested positive for trichomonas.  History reviewed. No pertinent past medical history.  There are no problems to display for this patient.   History reviewed. No pertinent surgical history.     Home Medications    Prior to Admission medications   Medication Sig Start Date End Date Taking? Authorizing Provider  metroNIDAZOLE (FLAGYL) 500 MG tablet Take 4 tablets (2,000 mg total) by mouth once for 1 dose. 10/28/22 10/28/22 Yes Smith Potenza, Jeannett Senior, FNP  ondansetron (ZOFRAN) 8 MG tablet Take 1 tablet (8 mg total) by mouth every 8 (eight) hours as needed for nausea or vomiting. 08/16/22   Margarita Grizzle, MD    Family History Family History  Problem Relation Age of Onset   Healthy Mother    Healthy Father     Social History Social History   Tobacco Use   Smoking status: Some Days    Types: Cigars   Smokeless tobacco: Never  Vaping Use   Vaping Use: Never used  Substance Use Topics   Alcohol use: Yes    Comment: occ   Drug use: Yes    Frequency: 7.0 times per week    Types: Marijuana     Allergies   Patient has no known allergies.   Review of Systems Review of Systems   Physical Exam Triage Vital Signs ED Triage Vitals  Enc Vitals Group     BP 10/28/22 1455 118/72     Pulse Rate 10/28/22 1455 88     Resp 10/28/22 1455 18     Temp 10/28/22 1455 98.1 F (36.7 C)     Temp Source 10/28/22 1455 Oral     SpO2 10/28/22 1455 98 %     Weight --      Height --      Head Circumference --      Peak Flow --      Pain Score 10/28/22 1440 0     Pain Loc --      Pain Edu? --       Excl. in GC? --    No data found.  Updated Vital Signs BP 118/72 (BP Location: Left Arm)   Pulse 88   Temp 98.1 F (36.7 C) (Oral)   Resp 18   SpO2 98%   Visual Acuity Right Eye Distance:   Left Eye Distance:   Bilateral Distance:    Right Eye Near:   Left Eye Near:    Bilateral Near:     Physical Exam Vitals reviewed.  Constitutional:      Appearance: Normal appearance.  Skin:    General: Skin is warm and dry.  Neurological:     General: No focal deficit present.     Mental Status: He is alert and oriented to person, place, and time.  Psychiatric:        Mood and Affect: Mood normal.        Behavior: Behavior normal.      UC Treatments / Results  Labs (all labs ordered are listed, but only abnormal results  are displayed) Labs Reviewed  POCT URINALYSIS DIP (MANUAL ENTRY)  CYTOLOGY, (ORAL, ANAL, URETHRAL) ANCILLARY ONLY    EKG   Radiology No results found.  Procedures Procedures (including critical care time)  Medications Ordered in UC Medications - No data to display  Initial Impression / Assessment and Plan / UC Course  I have reviewed the triage vital signs and the nursing notes.  Pertinent labs & imaging results that were available during my care of the patient were reviewed by me and considered in my medical decision making (see chart for details).   UA is nonsuggestive of urinary tract infection.  Will treat for trichomonas empirically based on his exposure.  Discharging with a prescription for metronidazole 2 g.  Reviewed chart history.   Counseled patient on potential for adverse effects with medications prescribed/recommended today, ER and return-to-clinic precautions discussed, patient verbalized understanding and agreement with care plan.    Final Clinical Impressions(s) / UC Diagnoses   Final diagnoses:  STD exposure  Trichomonas exposure     Discharge Instructions      I have prescribed a medication because of your exposure  to trichomonas.  Take the full dose all at once.  The results of your other testing will be either posted to your MyChart account or, if positive, someone will contact you to arrange treatment.  Follow up here or with your primary care provider if your symptoms are worsening or not improving.         ED Prescriptions     Medication Sig Dispense Auth. Provider   metroNIDAZOLE (FLAGYL) 500 MG tablet Take 4 tablets (2,000 mg total) by mouth once for 1 dose. 4 tablet Yarithza Mink, FNP      PDMP not reviewed this encounter.   Charma Igo, [place] 10/28/22 1511

## 2022-10-28 NOTE — Discharge Instructions (Addendum)
I have prescribed a medication because of your exposure to trichomonas.  Take the full dose all at once.  The results of your other testing will be either posted to your MyChart account or, if positive, someone will contact you to arrange treatment.  Follow up here or with your primary care provider if your symptoms are worsening or not improving.

## 2022-10-29 LAB — CYTOLOGY, (ORAL, ANAL, URETHRAL) ANCILLARY ONLY
Chlamydia: NEGATIVE
Comment: NEGATIVE
Comment: NEGATIVE
Comment: NORMAL
Neisseria Gonorrhea: NEGATIVE
Trichomonas: POSITIVE — AB

## 2023-02-01 ENCOUNTER — Emergency Department (HOSPITAL_COMMUNITY)
Admission: EM | Admit: 2023-02-01 | Discharge: 2023-02-01 | Disposition: A | Discharge status: Home / Self Care | Payer: No Typology Code available for payment source | Attending: Emergency Medicine | Admitting: Emergency Medicine

## 2023-02-01 ENCOUNTER — Other Ambulatory Visit: Payer: Self-pay

## 2023-02-01 ENCOUNTER — Emergency Department (HOSPITAL_COMMUNITY): Payer: No Typology Code available for payment source

## 2023-02-01 DIAGNOSIS — Y92410 Unspecified street and highway as the place of occurrence of the external cause: Secondary | ICD-10-CM | POA: Insufficient documentation

## 2023-02-01 DIAGNOSIS — R519 Headache, unspecified: Secondary | ICD-10-CM | POA: Insufficient documentation

## 2023-02-01 DIAGNOSIS — M545 Low back pain, unspecified: Secondary | ICD-10-CM | POA: Diagnosis not present

## 2023-02-01 MED ORDER — OXYCODONE-ACETAMINOPHEN 5-325 MG PO TABS
1.0000 | ORAL_TABLET | Freq: Once | ORAL | Status: AC
  Administered 2023-02-01: 1 via ORAL
  Filled 2023-02-01: qty 1
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
## 2023-02-01 NOTE — ED Notes (Signed)
Pt out from room , stating he has to leave, RN message provider , pt left before PA could come back to room

## 2023-08-16 ENCOUNTER — Ambulatory Visit
Admission: RE | Admit: 2023-08-16 | Discharge: 2023-08-16 | Disposition: A | Discharge status: Home / Self Care | Payer: Self-pay | Source: Ambulatory Visit | Attending: Emergency Medicine | Admitting: Emergency Medicine

## 2023-08-16 VITALS — BP 125/75 | HR 78 | Temp 97.8°F | Resp 16

## 2023-08-16 DIAGNOSIS — R369 Urethral discharge, unspecified: Secondary | ICD-10-CM | POA: Insufficient documentation

## 2023-08-16 DIAGNOSIS — R300 Dysuria: Secondary | ICD-10-CM | POA: Insufficient documentation

## 2023-08-16 DIAGNOSIS — Z202 Contact with and (suspected) exposure to infections with a predominantly sexual mode of transmission: Secondary | ICD-10-CM | POA: Insufficient documentation

## 2023-08-16 LAB — POCT URINALYSIS DIP (MANUAL ENTRY)
Bilirubin, UA: NEGATIVE
Blood, UA: NEGATIVE
Glucose, UA: NEGATIVE mg/dL
Ketones, POC UA: NEGATIVE mg/dL
Leukocytes, UA: NEGATIVE
Nitrite, UA: NEGATIVE
Protein Ur, POC: NEGATIVE mg/dL
Spec Grav, UA: 1.02
Urobilinogen, UA: 0.2 U/dL
pH, UA: 7

## 2023-08-16 MED ORDER — METRONIDAZOLE 500 MG PO TABS: 500.0000 mg | ORAL_TABLET | Freq: Two times a day (BID) | ORAL | 0 refills | Status: DC

## 2023-08-16 MED ORDER — METRONIDAZOLE 500 MG PO TABS: 500.0000 mg | ORAL_TABLET | Freq: Two times a day (BID) | ORAL | 0 refills | Status: AC

## 2023-08-16 NOTE — Discharge Instructions (Addendum)
 Take metronidazole twice a day for 7 days.  Stop this medication if your test is negative for trichomonas.  Your STD tests are pending.  If your test results are positive, we will call you.  You and your sexual partner(s) may require treatment at that time.  Do not have sexual activity for at least 7 days.

## 2023-08-16 NOTE — ED Triage Notes (Signed)
 Patient presents to [facility] for penile discharge, dysuria x 2 days. He states he was exposed to trich.

## 2023-08-16 NOTE — ED Provider Notes (Signed)
 Louis Hawkins    CSN: [id number] Arrival date & time: 08/16/23  0947      History   Chief Complaint Chief Complaint  Patient presents with   Exposure to STD    Entered by patient    HPI Louis Hawkins is a 33 y.o. male.  Patient presents with 2-day history of penile discharge and dysuria.  He states a sexual partner tested positive for trichomonas.  He denies rash, lesions, testicular pain, abdominal pain, hematuria, flank pain.  Patient's medical history includes trichomonas and chlamydia. The history is provided by the patient and medical records.    History reviewed. No pertinent past medical history.  There are no active problems to display for this patient.   History reviewed. No pertinent surgical history.     Home Medications    Prior to Admission medications   Medication Sig Start Date End Date Taking? Authorizing Provider  metroNIDAZOLE (FLAGYL) 500 MG tablet Take 1 tablet (500 mg total) by mouth 2 (two) times daily. 08/16/23  Yes Mickie Bail, NP  ondansetron (ZOFRAN) 8 MG tablet Take 1 tablet (8 mg total) by mouth every 8 (eight) hours as needed for nausea or vomiting. 08/16/22   Margarita Grizzle, MD    Family History Family History  Problem Relation Age of Onset   Healthy Mother    Healthy Father     Social History Social History   Tobacco Use   Smoking status: Some Days    Types: Cigars   Smokeless tobacco: Never  Vaping Use   Vaping status: Never Used  Substance Use Topics   Alcohol use: Yes    Comment: occ   Drug use: Yes    Frequency: 7.0 times per week    Types: Marijuana     Allergies   Patient has no known allergies.   Review of Systems Review of Systems  Constitutional:  Negative for chills and fever.  Gastrointestinal:  Negative for abdominal pain.  Genitourinary:  Positive for dysuria and penile discharge. Negative for flank pain, hematuria and testicular pain.  Skin:  Negative for color change, rash and wound.      Physical Exam Triage Vital Signs ED Triage Vitals  Encounter Vitals Group     BP      Systolic BP Percentile      Diastolic BP Percentile      Pulse      Resp      Temp      Temp src      SpO2      Weight      Height      Head Circumference      Peak Flow      Pain Score      Pain Loc      Pain Education      Exclude from Growth Chart    No data found.  Updated Vital Signs BP 125/75 (BP Location: Left Arm)   Pulse 78   Temp 97.8 F (36.6 C) (Temporal)   Resp 16   SpO2 98%   Visual Acuity Right Eye Distance:   Left Eye Distance:   Bilateral Distance:    Right Eye Near:   Left Eye Near:    Bilateral Near:     Physical Exam Constitutional:      General: He is not in acute distress. HENT:     Mouth/Throat:     Mouth: Mucous membranes are moist.  Cardiovascular:     Rate and  Rhythm: Normal rate and regular rhythm.  Pulmonary:     Effort: Pulmonary effort is normal. No respiratory distress.  Abdominal:     General: Bowel sounds are normal.     Palpations: Abdomen is soft.     Tenderness: There is no abdominal tenderness. There is no right CVA tenderness, left CVA tenderness, guarding or rebound.  Genitourinary:    Comments: Patient declines GU exam. Neurological:     Mental Status: He is alert.      UC Treatments / Results  Labs (all labs ordered are listed, but only abnormal results are displayed) Labs Reviewed  POCT URINALYSIS DIP (MANUAL ENTRY) - Abnormal; Notable for the following components:      Result Value   Color, UA light yellow (*)    All other components within normal limits  CYTOLOGY, (ORAL, ANAL, URETHRAL) ANCILLARY ONLY    EKG   Radiology No results found.  Procedures Procedures (including critical care time)  Medications Ordered in UC Medications - No data to display  Initial Impression / Assessment and Plan / UC Course  I have reviewed the triage vital signs and the nursing notes.  Pertinent labs & imaging  results that were available during my care of the patient were reviewed by me and considered in my medical decision making (see chart for details).    Penile discharge, exposure to trichomonas, dysuria.  Patient declines GU exam.  He obtained urethral self swab for testing.  Treating today with metronidazole.  Advised patient to stop this medication if his test is negative for trichomonas.  Instructed him to abstain from sexual activity for 7 days.  Discussed that we will call him if his treatment needs to be changed based on the test results.  Education provided on trichomoniasis.  He agrees to plan of care.  Final Clinical Impressions(s) / UC Diagnoses   Final diagnoses:  Penile discharge  Exposure to trichomonas  Dysuria     Discharge Instructions      Take metronidazole twice a day for 7 days.  Stop this medication if your test is negative for trichomonas.  Your STD tests are pending.  If your test results are positive, we will call you.  You and your sexual partner(s) may require treatment at that time.  Do not have sexual activity for at least 7 days.         ED Prescriptions     Medication Sig Dispense Auth. Provider   metroNIDAZOLE (FLAGYL) 500 MG tablet Take 1 tablet (500 mg total) by mouth 2 (two) times daily. 14 tablet Mickie Bail, NP      PDMP not reviewed this encounter.   Mickie Bail, NP 08/16/23 1055

## 2023-08-17 LAB — CYTOLOGY, (ORAL, ANAL, URETHRAL) ANCILLARY ONLY
Chlamydia: NEGATIVE
Comment: NEGATIVE
Comment: NEGATIVE
Comment: NORMAL
Neisseria Gonorrhea: NEGATIVE
Trichomonas: POSITIVE — AB

## 2023-11-13 ENCOUNTER — Encounter (HOSPITAL_COMMUNITY): Payer: Self-pay

## 2023-11-13 ENCOUNTER — Emergency Department (HOSPITAL_COMMUNITY): Payer: Self-pay

## 2023-11-13 ENCOUNTER — Other Ambulatory Visit: Payer: Self-pay

## 2023-11-13 ENCOUNTER — Emergency Department (HOSPITAL_COMMUNITY)
Admission: EM | Admit: 2023-11-13 | Discharge: 2023-11-13 | Disposition: A | Discharge status: Home / Self Care | Payer: Self-pay | Attending: Emergency Medicine | Admitting: Emergency Medicine

## 2023-11-13 DIAGNOSIS — S161XXA Strain of muscle, fascia and tendon at neck level, initial encounter: Secondary | ICD-10-CM | POA: Diagnosis not present

## 2023-11-13 DIAGNOSIS — Y92410 Unspecified street and highway as the place of occurrence of the external cause: Secondary | ICD-10-CM | POA: Insufficient documentation

## 2023-11-13 DIAGNOSIS — M542 Cervicalgia: Secondary | ICD-10-CM | POA: Diagnosis present

## 2023-11-13 MED ORDER — CYCLOBENZAPRINE HCL 10 MG PO TABS: 10.0000 mg | ORAL_TABLET | Freq: Every day | ORAL | 0 refills | Status: AC

## 2023-11-13 MED ORDER — OXYCODONE-ACETAMINOPHEN 5-325 MG PO TABS
1.0000 | ORAL_TABLET | Freq: Once | ORAL | Status: AC
  Administered 2023-11-13: 1 via ORAL
  Filled 2023-11-13: qty 1
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
